# Patient Record
Sex: Female | Born: 1950 | Race: White | Hispanic: No | Marital: Married | Smoking: Never smoker
Health system: Southern US, Community
[De-identification: ages and names within clinical notes are randomized; demographics above are authoritative.]

## PROBLEM LIST (undated history)

## (undated) DIAGNOSIS — F419 Anxiety disorder, unspecified: Secondary | ICD-10-CM

## (undated) DIAGNOSIS — M549 Dorsalgia, unspecified: Secondary | ICD-10-CM

## (undated) DIAGNOSIS — E785 Hyperlipidemia, unspecified: Secondary | ICD-10-CM

## (undated) DIAGNOSIS — E119 Type 2 diabetes mellitus without complications: Secondary | ICD-10-CM

## (undated) DIAGNOSIS — G629 Polyneuropathy, unspecified: Secondary | ICD-10-CM

## (undated) DIAGNOSIS — I639 Cerebral infarction, unspecified: Secondary | ICD-10-CM

## (undated) DIAGNOSIS — I10 Essential (primary) hypertension: Secondary | ICD-10-CM

## (undated) HISTORY — DX: Polyneuropathy, unspecified: G62.9

## (undated) HISTORY — PX: TONSILLECTOMY AND ADENOIDECTOMY: SUR1326

## (undated) HISTORY — DX: Essential (primary) hypertension: I10

## (undated) HISTORY — DX: Dorsalgia, unspecified: M54.9

## (undated) HISTORY — DX: Type 2 diabetes mellitus without complications: E11.9

## (undated) HISTORY — PX: CHOLECYSTECTOMY: SHX55

## (undated) HISTORY — DX: Hyperlipidemia, unspecified: E78.5

## (undated) HISTORY — PX: ABDOMINAL HYSTERECTOMY: SHX81

## (undated) HISTORY — DX: Anxiety disorder, unspecified: F41.9

---

## 2008-12-05 ENCOUNTER — Encounter: Payer: Self-pay | Admitting: Pulmonary Disease

## 2008-12-05 DIAGNOSIS — J40 Bronchitis, not specified as acute or chronic: Secondary | ICD-10-CM | POA: Insufficient documentation

## 2009-03-25 ENCOUNTER — Encounter: Payer: Self-pay | Admitting: Cardiology

## 2009-03-27 ENCOUNTER — Encounter: Payer: Self-pay | Admitting: Cardiology

## 2009-04-03 ENCOUNTER — Encounter: Payer: Self-pay | Admitting: Cardiology

## 2009-04-15 ENCOUNTER — Encounter: Payer: Self-pay | Admitting: Cardiology

## 2009-04-21 ENCOUNTER — Encounter: Payer: Self-pay | Admitting: Cardiology

## 2009-05-30 DIAGNOSIS — F329 Major depressive disorder, single episode, unspecified: Secondary | ICD-10-CM

## 2009-05-30 DIAGNOSIS — R42 Dizziness and giddiness: Secondary | ICD-10-CM

## 2009-05-30 DIAGNOSIS — E113299 Type 2 diabetes mellitus with mild nonproliferative diabetic retinopathy without macular edema, unspecified eye: Secondary | ICD-10-CM

## 2009-05-30 DIAGNOSIS — R55 Syncope and collapse: Secondary | ICD-10-CM | POA: Insufficient documentation

## 2009-05-30 DIAGNOSIS — R030 Elevated blood-pressure reading, without diagnosis of hypertension: Secondary | ICD-10-CM | POA: Insufficient documentation

## 2009-05-30 DIAGNOSIS — I10 Essential (primary) hypertension: Secondary | ICD-10-CM

## 2009-05-30 DIAGNOSIS — R7309 Other abnormal glucose: Secondary | ICD-10-CM

## 2009-05-30 DIAGNOSIS — E1039 Type 1 diabetes mellitus with other diabetic ophthalmic complication: Secondary | ICD-10-CM | POA: Insufficient documentation

## 2009-06-02 ENCOUNTER — Encounter: Payer: Self-pay | Admitting: Cardiology

## 2014-03-04 LAB — HM DIABETES EYE EXAM

## 2016-05-03 ENCOUNTER — Encounter: Payer: Self-pay | Admitting: Physician Assistant

## 2016-05-03 ENCOUNTER — Ambulatory Visit (INDEPENDENT_AMBULATORY_CARE_PROVIDER_SITE_OTHER): Payer: BLUE CROSS/BLUE SHIELD | Admitting: Physician Assistant

## 2016-05-03 ENCOUNTER — Encounter (INDEPENDENT_AMBULATORY_CARE_PROVIDER_SITE_OTHER): Payer: Self-pay

## 2016-05-03 VITALS — BP 129/87 | HR 86 | Temp 98.3°F | Ht 64.5 in | Wt 157.6 lb

## 2016-05-03 DIAGNOSIS — Z1159 Encounter for screening for other viral diseases: Secondary | ICD-10-CM

## 2016-05-03 DIAGNOSIS — M5090 Cervical disc disorder, unspecified, unspecified cervical region: Secondary | ICD-10-CM

## 2016-05-03 DIAGNOSIS — F329 Major depressive disorder, single episode, unspecified: Secondary | ICD-10-CM | POA: Diagnosis not present

## 2016-05-03 DIAGNOSIS — F32A Depression, unspecified: Secondary | ICD-10-CM

## 2016-05-03 DIAGNOSIS — M5137 Other intervertebral disc degeneration, lumbosacral region: Secondary | ICD-10-CM | POA: Diagnosis not present

## 2016-05-03 DIAGNOSIS — Z6826 Body mass index (BMI) 26.0-26.9, adult: Secondary | ICD-10-CM

## 2016-05-03 DIAGNOSIS — E113299 Type 2 diabetes mellitus with mild nonproliferative diabetic retinopathy without macular edema, unspecified eye: Secondary | ICD-10-CM

## 2016-05-03 DIAGNOSIS — I10 Essential (primary) hypertension: Secondary | ICD-10-CM | POA: Diagnosis not present

## 2016-05-03 DIAGNOSIS — E785 Hyperlipidemia, unspecified: Secondary | ICD-10-CM | POA: Diagnosis not present

## 2016-05-03 DIAGNOSIS — E1039 Type 1 diabetes mellitus with other diabetic ophthalmic complication: Secondary | ICD-10-CM | POA: Diagnosis not present

## 2016-05-03 LAB — BAYER DCA HB A1C WAIVED: HB A1C (BAYER DCA - WAIVED): 8.7 % — ABNORMAL HIGH (ref <7.0)

## 2016-05-03 MED ORDER — OXYCODONE HCL 30 MG PO TABS: 30.0000 mg | ORAL_TABLET | Freq: Four times a day (QID) | ORAL | 0 refills | Status: DC

## 2016-05-03 MED ORDER — METHYLPREDNISOLONE ACETATE 80 MG/ML IJ SUSP
80.0000 mg | Freq: Once | INTRAMUSCULAR | Status: AC
  Administered 2016-05-03: 80 mg via INTRAMUSCULAR

## 2016-05-03 MED ORDER — GABAPENTIN 800 MG PO TABS: 800.0000 mg | ORAL_TABLET | Freq: Three times a day (TID) | ORAL | 11 refills | Status: DC

## 2016-05-03 MED ORDER — MELOXICAM 7.5 MG PO TABS: 7.5000 mg | ORAL_TABLET | Freq: Every day | ORAL | 11 refills | Status: DC

## 2016-05-03 NOTE — Patient Instructions (Signed)

## 2016-05-03 NOTE — Progress Notes (Signed)
BP 129/87 (BP Location: Left Arm, Patient Position: Sitting, Cuff Size: Normal)   Pulse 86   Temp 98.3 F (36.8 C) (Oral)   Ht 5' 4.5" (1.638 m)   Wt 157 lb 9.6 oz (71.5 kg)   BMI 26.63 kg/m    Subjective:    Patient ID: Caitlin Pennington, female    DOB: 1951/06/20, 65 y.o.   MRN: [id number]  HPI: Caitlin Pennington is a 65 y.o. female presenting on 05/03/2016 for Back Pain (Medication refill )  HPI Patient here to be established as new patient at [facility].  This patient is known to me from [facility] This patient comes in having significantly increased lumbar pain related to her degenerative disc disease. She had an MRI that showed positive degenerative disc disorder and some arthritis changes. She would like an arthritis medication if possible. We have discussed Mobic as a possibility. In addition she is due her medication refills on her oxycodone. We have discussed that at [facility] anyone on chronic pain medication has to be followed up pain management. She is in agreement to see a specialist for this. She is also hopeful that they may have some other options to help with her pain. The neurosurgeon did not feel that she was a surgical candidate at this time after her MRI. Although this got tremendously started approximately 1 year ago when she had retinal tears bilaterally that had a repaired with an air bubble procedure. She had to sleep in the downward facing chair. She develops severe cervical and lumbar pain having to sleep like this. Her healing on her eyes took longer than normal also.  Relevant past medical, surgical, family and social history reviewed and updated as indicated. Interim medical history since our last visit reviewed. Allergies and medications reviewed and updated.   Data reviewed from any sources in EPIC.  Review of Systems  Constitutional: Negative for activity change, fatigue and fever.  HENT: Negative.   Eyes:  Negative.   Respiratory: Negative.  Negative for cough.   Cardiovascular: Negative.  Negative for chest pain.  Gastrointestinal: Negative.  Negative for abdominal pain.  Endocrine: Negative.   Genitourinary: Negative.  Negative for dysuria.  Musculoskeletal: Positive for arthralgias, back pain, gait problem, myalgias and neck stiffness.  Skin: Negative.   Neurological: Positive for weakness.    Per HPI unless specifically indicated above  Social History   Social History  . Marital status: Married    Spouse name: N/A  . Number of children: N/A  . Years of education: N/A   Occupational History  . Not on file.   Social History Main Topics  . Smoking status: Never Smoker  . Smokeless tobacco: Never Used  . Alcohol use No  . Drug use: No  . Sexual activity: Not on file   Other Topics Concern  . Not on file   Social History Narrative  . No narrative on file    Past Surgical History:  Procedure Laterality Date  . ABDOMINAL HYSTERECTOMY    . CHOLECYSTECTOMY    . TONSILLECTOMY AND ADENOIDECTOMY      Family History  Problem Relation Age of Onset  . Congestive Heart Failure Mother   . Diabetes Mother   . Heart disease Father       Medication List       Accurate as of 05/03/16 12:04 PM. Always use your most recent med list.          ALPRAZolam  1 MG tablet Commonly known as:  XANAX Take 1 mg by mouth 3 (three) times daily.   gabapentin 800 MG tablet Commonly known as:  NEURONTIN Take 1 tablet (800 mg total) by mouth 3 (three) times daily.   GLOBAL EASE INJECT PEN NEEDLES 32G X 4 MM Misc Generic drug:  Insulin Pen Needle USE WITH INSULIN PENS UP TO 5 TIMES DAILY (BILL INSULIN FIRST FOR A $0 COPAY)   hydrochlorothiazide 25 MG tablet Commonly known as:  HYDRODIURIL Take 25 mg by mouth daily.   insulin aspart 100 UNIT/ML injection Commonly known as:  novoLOG Inject 20 Units into the skin 3 (three) times daily before meals.   LANTUS SOLOSTAR 100 UNIT/ML  Solostar Pen Generic drug:  Insulin Glargine Inject 30 Units into the skin at bedtime.   meloxicam 7.5 MG tablet Commonly known as:  MOBIC Take 1 tablet (7.5 mg total) by mouth daily.   oxycodone 30 MG immediate release tablet Commonly known as:  ROXICODONE Take 1 tablet (30 mg total) by mouth every 6 (six) hours.   pantoprazole 40 MG tablet Commonly known as:  PROTONIX Take 40 mg by mouth daily.   VICTOZA 18 MG/3ML Sopn Generic drug:  Liraglutide Inject 1.8 mg into the skin.          Objective:    BP 129/87 (BP Location: Left Arm, Patient Position: Sitting, Cuff Size: Normal)   Pulse 86   Temp 98.3 F (36.8 C) (Oral)   Ht 5' 4.5" (1.638 m)   Wt 157 lb 9.6 oz (71.5 kg)   BMI 26.63 kg/m   Allergies  Allergen Reactions  . Demerol [Meperidine] Swelling  . Stadol [Butorphanol] Swelling   Wt Readings from Last 3 Encounters:  05/03/16 157 lb 9.6 oz (71.5 kg)    Physical Exam  Constitutional: She is oriented to person, place, and time. She appears well-developed and well-nourished.  HENT:  Head: Normocephalic and atraumatic.  Eyes: Conjunctivae and EOM are normal. Pupils are equal, round, and reactive to light.  Neck: Normal range of motion. Neck supple.  Cardiovascular: Normal rate, regular rhythm, normal heart sounds and intact distal pulses.   Pulmonary/Chest: Effort normal and breath sounds normal.  Abdominal: Soft. Bowel sounds are normal.  Musculoskeletal: She exhibits tenderness and deformity.       Lumbar back: She exhibits decreased range of motion, tenderness, swelling, deformity, pain and spasm.  Neurological: She is alert and oriented to person, place, and time. She has normal reflexes.  Skin: Skin is warm and dry. No rash noted.  Psychiatric: She has a normal mood and affect. Her behavior is normal. Judgment and thought content normal.    No results found for this or any previous visit.    Assessment & Plan:   1. Essential hypertension Low salt  diet, walk daily  2. Background diabetic retinopathy ([place]) Continue with Retina Specialist  3. Depression Continue medications  4. Cervical disc disease Heat and stretch - meloxicam (MOBIC) 7.5 MG tablet; Take 1 tablet (7.5 mg total) by mouth daily.  Dispense: 30 tablet; Refill: 11 - oxycodone (ROXICODONE) 30 MG immediate release tablet; Take 1 tablet (30 mg total) by mouth every 6 (six) hours.  Dispense: 240 tablet; Refill: 0  5. Degeneration of lumbar or lumbosacral intervertebral disc Heat and stretch Neurosurgeon saw MRI from this past year and saw her and did not recommended surgery at this time.  We have discussed that the policy at [facility] is to have patients who take chronic  pain medications to be followed by Pain Management and she is very agreeable to have the referral and any possible solutions they may have. Will obtain records from [facility]. - meloxicam (MOBIC) 7.5 MG tablet; Take 1 tablet (7.5 mg total) by mouth daily.  Dispense: 30 tablet; Refill: 11 - oxycodone (ROXICODONE) 30 MG immediate release tablet; Take 1 tablet (30 mg total) by mouth every 6 (six) hours.  Dispense: 240 tablet; Refill: 0  6. 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 Need for hepatitis C screening test Patient aware and will get tested - Hepatitis C antibody  No other refills are due today but she is to continue all listed meds Continue all other maintenance medications as listed above.  Follow up plan: Return in about 3 months (around 08/03/2016), or lab and recheck.  Terald Sleeper PA-C [facility] [address]  [place], [place] [address] [phone]   05/03/2016, 12:04 PM

## 2016-05-04 LAB — CMP14+EGFR
ALBUMIN: 4.3 g/dL (ref 3.6–4.8)
ALT: 16 IU/L (ref 0–32)
AST: 18 IU/L (ref 0–40)
Albumin/Globulin Ratio: 1.5 (ref 1.2–2.2)
Alkaline Phosphatase: 70 IU/L (ref 39–117)
BUN/Creatinine Ratio: 14 (ref 12–28)
BUN: 11 mg/dL (ref 8–27)
Bilirubin Total: 0.3 mg/dL (ref 0.0–1.2)
CALCIUM: 9.6 mg/dL (ref 8.7–10.3)
CHLORIDE: 97 mmol/L (ref 96–106)
CO2: 27 mmol/L (ref 18–29)
CREATININE: 0.77 mg/dL (ref 0.57–1.00)
GFR calc Af Amer: 94 mL/min/{1.73_m2} (ref >59)
GFR, EST NON AFRICAN AMERICAN: 81 mL/min/{1.73_m2} (ref >59)
GLOBULIN, TOTAL: 2.8 g/dL (ref 1.5–4.5)
GLUCOSE: 120 mg/dL — AB (ref 65–99)
POTASSIUM: 4.3 mmol/L (ref 3.5–5.2)
Sodium: 141 mmol/L (ref 134–144)
Total Protein: 7.1 g/dL (ref 6.0–8.5)

## 2016-05-04 LAB — LIPID PANEL
CHOLESTEROL TOTAL: 245 mg/dL — AB (ref 100–199)
Chol/HDL Ratio: 5.1 ratio units — ABNORMAL HIGH (ref 0.0–4.4)
HDL: 48 mg/dL (ref >39)
LDL Calculated: 162 mg/dL — ABNORMAL HIGH (ref 0–99)
Triglycerides: 175 mg/dL — ABNORMAL HIGH (ref 0–149)
VLDL Cholesterol Cal: 35 mg/dL (ref 5–40)

## 2016-05-04 LAB — HEPATITIS C ANTIBODY: Hep C Virus Ab: <0.1 s/co ratio (ref 0.0–0.9)

## 2016-05-20 ENCOUNTER — Telehealth: Payer: Self-pay | Admitting: Physician Assistant

## 2016-05-20 NOTE — Telephone Encounter (Signed)
Spoke with patient and explained where to look for messages and labs in [place].

## 2016-05-25 ENCOUNTER — Other Ambulatory Visit: Payer: Self-pay

## 2016-05-25 ENCOUNTER — Telehealth: Payer: Self-pay | Admitting: Physician Assistant

## 2016-05-25 MED ORDER — FENOFIBRATE MICRONIZED 134 MG PO CAPS
134.0000 mg | ORAL_CAPSULE | Freq: Every day | ORAL | 11 refills | Status: DC
Start: 2016-05-25 — End: 2017-06-15

## 2016-05-25 NOTE — Telephone Encounter (Signed)
Medication sent.

## 2016-06-17 DIAGNOSIS — M47816 Spondylosis without myelopathy or radiculopathy, lumbar region: Secondary | ICD-10-CM | POA: Diagnosis not present

## 2016-06-17 DIAGNOSIS — Z79899 Other long term (current) drug therapy: Secondary | ICD-10-CM | POA: Diagnosis not present

## 2016-06-17 DIAGNOSIS — G8929 Other chronic pain: Secondary | ICD-10-CM | POA: Diagnosis not present

## 2016-06-17 DIAGNOSIS — M5136 Other intervertebral disc degeneration, lumbar region: Secondary | ICD-10-CM | POA: Diagnosis not present

## 2016-06-17 DIAGNOSIS — G629 Polyneuropathy, unspecified: Secondary | ICD-10-CM | POA: Diagnosis not present

## 2016-06-24 ENCOUNTER — Other Ambulatory Visit: Payer: Self-pay | Admitting: Physician Assistant

## 2016-06-30 ENCOUNTER — Ambulatory Visit (INDEPENDENT_AMBULATORY_CARE_PROVIDER_SITE_OTHER): Payer: BLUE CROSS/BLUE SHIELD | Admitting: Physician Assistant

## 2016-06-30 ENCOUNTER — Encounter: Payer: Self-pay | Admitting: Physician Assistant

## 2016-06-30 VITALS — BP 154/82 | HR 78 | Temp 97.9°F | Ht 64.0 in | Wt 162.0 lb

## 2016-06-30 DIAGNOSIS — M5137 Other intervertebral disc degeneration, lumbosacral region: Secondary | ICD-10-CM

## 2016-06-30 DIAGNOSIS — Z8261 Family history of arthritis: Secondary | ICD-10-CM | POA: Diagnosis not present

## 2016-06-30 DIAGNOSIS — M2550 Pain in unspecified joint: Secondary | ICD-10-CM | POA: Diagnosis not present

## 2016-06-30 DIAGNOSIS — M5090 Cervical disc disorder, unspecified, unspecified cervical region: Secondary | ICD-10-CM | POA: Diagnosis not present

## 2016-06-30 DIAGNOSIS — M2560 Stiffness of unspecified joint, not elsewhere classified: Secondary | ICD-10-CM

## 2016-06-30 DIAGNOSIS — Z23 Encounter for immunization: Secondary | ICD-10-CM | POA: Diagnosis not present

## 2016-06-30 MED ORDER — OXYCODONE HCL 30 MG PO TABS: 30.0000 mg | ORAL_TABLET | Freq: Four times a day (QID) | ORAL | 0 refills | Status: DC

## 2016-06-30 MED ORDER — PREDNISONE 20 MG PO TABS: 40.0000 mg | ORAL_TABLET | Freq: Every day | ORAL | 0 refills | Status: DC

## 2016-06-30 MED ORDER — OXYCODONE HCL 30 MG PO TABS: 30.0000 mg | ORAL_TABLET | ORAL | 0 refills | Status: DC | PRN

## 2016-06-30 NOTE — Patient Instructions (Signed)
Rheumatoid Arthritis  Rheumatoid arthritis is a long-term (chronic) inflammatory disease that causes pain, swelling, and stiffness of the joints. It can affect the entire body, including the eyes and lungs. The effects of rheumatoid arthritis vary widely among those with the condition.  CAUSES  The cause of rheumatoid arthritis is not known. It tends to run in families and is more common in women. Certain cells of the body's natural defense system (immune system) do not work properly and begin to attack healthy joints. It primarily involves the connective tissue that lines the joints (synovial membrane). This can cause damage to the joint.  SYMPTOMS  · Pain, stiffness, swelling, and decreased motion of many joints, especially in the hands and feet.  · Stiffness that is worse in the morning. It may last 1-2 hours or longer.  · Numbness and tingling in the hands.  · Fatigue.  · Loss of appetite.  · Weight loss.  · Low-grade fever.  · Dry eyes and mouth.  · Firm lumps (rheumatoid nodules) that grow beneath the skin in areas such as the elbows and hands.  DIAGNOSIS  Diagnosis is based on the symptoms described, an exam, and blood tests. Sometimes, X-rays are helpful.  TREATMENT  The goals of treatment are to relieve pain, reduce inflammation, and to slow down or stop joint damage and disability. Methods vary and may include:  · Maintaining a balance of rest, exercise, and proper nutrition.  · Your health care provider may adjust your medicines every 3 months until treatment goals are reached. Common medicines include:    Pain relievers (analgesics).    Corticosteroids and nonsteroidal anti-inflammatory drugs (NSAIDs) to reduce inflammation.    Disease-modifying antirheumatic drugs (DMARDs) to try to slow the course of the disease.    Biologic response modifiers to reduce inflammation and damage.  · Physical therapy and occupational therapy.  · Surgery for patients with severe joint damage. Joint replacement or fusing of  joints may be needed.  · Routine monitoring and ongoing care, such as office visits, blood and urine tests, and X-rays.  Your health care provider will work with you to identify the best treatment option for you, based on an assessment of the overall disease activity in your body.  HOME CARE INSTRUCTIONS  · Remain physically active and reduce activity when the disease gets worse.  · Eat a well-balanced diet.  · Put heat on affected joints when you wake up and before activities. Keep the heat on the affected joint for as long as directed by your health care provider.  · Put ice on affected joints following activities or exercising.    Put ice in a plastic bag.    Place a towel between your skin and the bag.    Leave the ice on for 15-20 minutes, 3-4 times per day, or as directed by your health care provider.  · Take medicines and supplements only as directed by your health care provider.  · Use splints as directed by your health care provider. Splints help maintain joint position and function.  · Do not sleep with pillows under your knees. This may lead to spasms.  · Participate in a self-management program to keep current with the latest treatment and coping skills.  SEEK IMMEDIATE MEDICAL CARE IF:  · You have fainting episodes.  · You have periods of extreme weakness.  · You rapidly develop a hot, painful joint that is more severe than usual joint aches.  · You have chills.  ·   You have a fever.  FOR MORE INFORMATION  · American College of Rheumatology: www.rheumatology.org  · Arthritis Foundation: www.arthritis.org     This information is not intended to replace advice given to you by your health care provider. Make sure you discuss any questions you have with your health care provider.     Document Released: 08/20/2000 Document Revised: 09/13/2014 Document Reviewed: 09/29/2011  Elsevier Interactive Patient Education ©2016 Elsevier Inc.

## 2016-06-30 NOTE — Progress Notes (Addendum)
BP (!) 154/82   Pulse 78   Temp 97.9 F (36.6 C) (Oral)   Ht 5\' 4"  (1.626 m)   Wt 162 lb (73.5 kg)   BMI 27.81 kg/m    Subjective:    Patient ID: Caitlin Pennington, female    DOB: 03/09/1951, 65 y.o.   MRN: [id number]  HPI: Caitlin BentCarol Ronk is a 65 y.o. female presenting on 06/30/2016 for Neck Pain (2 weeks) and left knee pain  Patient has had severe AM stiffness that lasts greater than 30 minutes for about 4 weeks. Has family history of Ra. She has known DDD of the lumbar spine, but due to very complicated retinal surgeries she has had to postpone her treatment.  Joints do have swelling and redness at times. She is currently on mobic 7.5 mg.  Her known degenerative disc disease of the cervical and lumbar spine have been an ongoing problem over the past 2 years. She has actually had FMLA leave related to this. In addition she had had in the past and family leave related to her diabetes management but at this time it is fairly well-controlled. She may need to have FMLA paperwork performed that will allow her to miss work for needed future appointments with us or with referral specialist.  Past Medical History:  Diagnosis Date  . Anxiety   . Back pain   . Diabetes mellitus without complication (HCC)   . Hyperlipidemia   . Hypertension   . Neuropathy (HCC)    Relevant past medical, surgical, family and social history reviewed and updated as indicated. Interim medical history since our last visit reviewed. Allergies and medications reviewed and updated. DATA REVIEWED: CHART IN EPIC  Social History   Social History  . Marital status: Married    Spouse name: N/A  . Number of children: N/A  . Years of education: N/A   Occupational History  . Not on file.   Social History Main Topics  . Smoking status: Never Smoker  . Smokeless tobacco: Never Used  . Alcohol use No  . Drug use: No  . Sexual activity: Not on file   Other Topics Concern  . Not on file   Social History Narrative  .  No narrative on file    Past Surgical History:  Procedure Laterality Date  . ABDOMINAL HYSTERECTOMY    . CHOLECYSTECTOMY    . TONSILLECTOMY AND ADENOIDECTOMY      Family History  Problem Relation Age of Onset  . Congestive Heart Failure Mother   . Diabetes Mother   . Heart disease Father     Review of Systems  Constitutional: Positive for fatigue and fever. Negative for activity change.  HENT: Negative.   Eyes: Negative.   Respiratory: Negative.  Negative for cough.   Cardiovascular: Negative.  Negative for chest pain.  Gastrointestinal: Negative.  Negative for abdominal pain.  Endocrine: Negative.   Genitourinary: Negative.  Negative for dysuria.  Musculoskeletal: Positive for arthralgias, back pain, gait problem, joint swelling, myalgias and neck stiffness.  Skin: Negative.       Medication List       Accurate as of 06/30/16  5:03 PM. Always use your most recent med list.          ALPRAZolam 1 MG tablet Commonly known as:  XANAX TAKE 1 TABLET BY MOUTH THREE TIMES DAILY   fenofibrate micronized 134 MG capsule Commonly known as:  LOFIBRA Take 1 capsule (134 mg total) by mouth daily before breakfast.  gabapentin 800 MG tablet Commonly known as:  NEURONTIN Take 1 tablet (800 mg total) by mouth 3 (three) times daily.   GLOBAL EASE INJECT PEN NEEDLES 32G X 4 MM Misc Generic drug:  Insulin Pen Needle USE WITH INSULIN PENS UP TO 5 TIMES DAILY (BILL INSULIN FIRST FOR A $0 COPAY)   hydrochlorothiazide 25 MG tablet Commonly known as:  HYDRODIURIL Take 25 mg by mouth daily.   insulin aspart 100 UNIT/ML injection Commonly known as:  novoLOG Inject 20 Units into the skin 3 (three) times daily before meals.   LANTUS SOLOSTAR 100 UNIT/ML Solostar Pen Generic drug:  Insulin Glargine Inject 30 Units into the skin at bedtime.   meloxicam 7.5 MG tablet Commonly known as:  MOBIC Take 1 tablet (7.5 mg total) by mouth daily.   oxycodone 30 MG immediate release  tablet Commonly known as:  ROXICODONE Take 1 tablet (30 mg total) by mouth every 6 (six) hours.   oxycodone 30 MG immediate release tablet Commonly known as:  ROXICODONE Take 1 tablet (30 mg total) by mouth every 4 (four) hours as needed for pain.   pantoprazole 40 MG tablet Commonly known as:  PROTONIX Take 40 mg by mouth daily.   predniSONE 20 MG tablet Commonly known as:  DELTASONE Take 2 tablets (40 mg total) by mouth daily with breakfast. After 14 days, reduce to 20 mg once daily, then 7 days take 10 daily   VICTOZA 18 MG/3ML Sopn Generic drug:  liraglutide Inject 1.8 mg into the skin.          Objective:    BP (!) 154/82   Pulse 78   Temp 97.9 F (36.6 C) (Oral)   Ht 5\' 4"  (1.626 m)   Wt 162 lb (73.5 kg)   BMI 27.81 kg/m   Allergies  Allergen Reactions  . Demerol [Meperidine] Swelling  . Stadol [Butorphanol] Swelling    Wt Readings from Last 3 Encounters:  06/30/16 162 lb (73.5 kg)  05/03/16 157 lb 9.6 oz (71.5 kg)    Physical Exam  Constitutional: She is oriented to person, place, and time. She appears well-developed and well-nourished.  HENT:  Head: Normocephalic and atraumatic.  Eyes: Conjunctivae and EOM are normal. Pupils are equal, round, and reactive to light.  Cardiovascular: Normal rate, regular rhythm, normal heart sounds and intact distal pulses.   Pulmonary/Chest: Effort normal and breath sounds normal.  Abdominal: Soft. Bowel sounds are normal.  Musculoskeletal:       Right knee: She exhibits decreased range of motion and effusion. Tenderness found.       Left knee: She exhibits decreased range of motion and effusion. Tenderness found.       Cervical back: She exhibits decreased range of motion, pain and spasm.       Lumbar back: She exhibits decreased range of motion, tenderness, deformity and spasm.  Neurological: She is alert and oriented to person, place, and time. She has normal reflexes.  Skin: Skin is warm and dry. No rash noted.    Psychiatric: She has a normal mood and affect. Her behavior is normal. Judgment and thought content normal.    Results for orders placed or performed in visit on 05/11/16  HM DIABETES EYE EXAM  Result Value Ref Range   HM Diabetic Eye Exam No Retinopathy No Retinopathy      Assessment & Plan:   1. Pain in joint involving multiple sites - Arthritis Panel - predniSONE (DELTASONE) 20 MG tablet; Take 2 tablets (  40 mg total) by mouth daily with breakfast. After 14 days, reduce to 20 mg once daily, then 7 days take 10 daily  Dispense: 40 tablet; Refill: 0 - ANA - oxycodone (ROXICODONE) 30 MG immediate release tablet; Take 1 tablet (30 mg total) by mouth every 4 (four) hours as needed for pain.  Dispense: 120 tablet; Refill: 0  2. Stiffness in joint - Arthritis Panel - predniSONE (DELTASONE) 20 MG tablet; Take 2 tablets (40 mg total) by mouth daily with breakfast. After 14 days, reduce to 20 mg once daily, then 7 days take 10 daily  Dispense: 40 tablet; Refill: 0 - ANA  3. Family history of rheumatoid arthritis - Arthritis Panel - predniSONE (DELTASONE) 20 MG tablet; Take 2 tablets (40 mg total) by mouth daily with breakfast. After 14 days, reduce to 20 mg once daily, then 7 days take 10 daily  Dispense: 40 tablet; Refill: 0  4. Cervical disc disease - oxycodone (ROXICODONE) 30 MG immediate release tablet; Take 1 tablet (30 mg total) by mouth every 6 (six) hours.  Dispense: 120 tablet; Refill: 0  5. Degeneration of lumbar or lumbosacral intervertebral disc - oxycodone (ROXICODONE) 30 MG immediate release tablet; Take 1 tablet (30 mg total) by mouth every 6 (six) hours.  Dispense: 120 tablet; Refill: 0 - oxycodone (ROXICODONE) 30 MG immediate release tablet; Take 1 tablet (30 mg total) by mouth every 4 (four) hours as needed for pain.  Dispense: 120 tablet; Refill: 0 Had issues at the [facility] Center with the attitude of the staff.  Was in tears when she left. Consider a different office, also  wait for labs to look at rheumatological conditions.  6. Encounter for immunization - Flu Vaccine QUAD 36+ mos IM   Continue all other maintenance medications as listed above.  Follow up plan: Return in about 3 months (around 09/30/2016) for recheck.  Orders Placed This Encounter  Procedures  . Flu Vaccine QUAD 36+ mos IM  . Arthritis Panel  . ANA    Educational handout given for rheumatoid arthritis  Remus Loffler PA-C [facility] [address]  [place], [place] [address] [phone]   06/30/2016, 5:03 PM

## 2016-07-01 LAB — ARTHRITIS PANEL
BASOS ABS: 0 10*3/uL (ref 0.0–0.2)
Basos: 1 %
EOS (ABSOLUTE): 0.1 10*3/uL (ref 0.0–0.4)
EOS: 2 %
HEMATOCRIT: 38.2 % (ref 34.0–46.6)
Hemoglobin: 12.8 g/dL (ref 11.1–15.9)
IMMATURE GRANULOCYTES: 0 %
Immature Grans (Abs): 0 10*3/uL (ref 0.0–0.1)
LYMPHS ABS: 1.5 10*3/uL (ref 0.7–3.1)
Lymphs: 22 %
MCH: 31.3 pg (ref 26.6–33.0)
MCHC: 33.5 g/dL (ref 31.5–35.7)
MCV: 93 fL (ref 79–97)
MONOCYTES: 9 %
Monocytes Absolute: 0.6 10*3/uL (ref 0.1–0.9)
NEUTROS PCT: 66 %
Neutrophils Absolute: 4.5 10*3/uL (ref 1.4–7.0)
PLATELETS: 239 10*3/uL (ref 150–379)
RBC: 4.09 x10E6/uL (ref 3.77–5.28)
RDW: 14.2 % (ref 12.3–15.4)
Rhuematoid fact SerPl-aCnc: <10 IU/mL (ref 0.0–13.9)
SED RATE: 2 mm/h (ref 0–40)
Uric Acid: 4.3 mg/dL (ref 2.5–7.1)
WBC: 6.9 10*3/uL (ref 3.4–10.8)

## 2016-07-01 LAB — ANA: ANA: POSITIVE — AB

## 2016-07-02 ENCOUNTER — Telehealth: Payer: Self-pay | Admitting: Physician Assistant

## 2016-07-02 DIAGNOSIS — R768 Other specified abnormal immunological findings in serum: Secondary | ICD-10-CM

## 2016-07-02 NOTE — Telephone Encounter (Signed)
Spoke with patient about labs and referral placed for rheumatology.

## 2016-07-02 NOTE — Telephone Encounter (Signed)
Patient advised that we can not approve her to have her pain medication filled a day early.

## 2016-07-13 ENCOUNTER — Other Ambulatory Visit: Payer: Self-pay | Admitting: Physician Assistant

## 2016-07-14 MED ORDER — ALPRAZOLAM 1 MG PO TABS
1.0000 mg | ORAL_TABLET | Freq: Three times a day (TID) | ORAL | 1 refills | Status: DC
Start: 2016-07-14 — End: 2016-09-15

## 2016-07-14 NOTE — Telephone Encounter (Signed)
okayed

## 2016-07-14 NOTE — Telephone Encounter (Signed)
Rx called in to pharmacy. 

## 2016-07-26 ENCOUNTER — Other Ambulatory Visit: Payer: Self-pay | Admitting: Physician Assistant

## 2016-08-03 ENCOUNTER — Ambulatory Visit: Payer: Medicare Other | Admitting: Physician Assistant

## 2016-08-19 ENCOUNTER — Other Ambulatory Visit: Payer: Self-pay | Admitting: Physician Assistant

## 2016-09-15 ENCOUNTER — Other Ambulatory Visit: Payer: Self-pay | Admitting: Physician Assistant

## 2016-09-16 NOTE — Telephone Encounter (Signed)
rx called into pharmacy and pt is aware. 

## 2016-09-30 ENCOUNTER — Ambulatory Visit (INDEPENDENT_AMBULATORY_CARE_PROVIDER_SITE_OTHER): Payer: BLUE CROSS/BLUE SHIELD | Admitting: Physician Assistant

## 2016-09-30 ENCOUNTER — Encounter: Payer: Self-pay | Admitting: Physician Assistant

## 2016-09-30 VITALS — BP 144/84 | HR 81 | Temp 98.0°F | Ht 64.0 in | Wt 157.8 lb

## 2016-09-30 DIAGNOSIS — M5137 Other intervertebral disc degeneration, lumbosacral region: Secondary | ICD-10-CM

## 2016-09-30 DIAGNOSIS — M2550 Pain in unspecified joint: Secondary | ICD-10-CM | POA: Insufficient documentation

## 2016-09-30 DIAGNOSIS — F411 Generalized anxiety disorder: Secondary | ICD-10-CM | POA: Insufficient documentation

## 2016-09-30 DIAGNOSIS — I10 Essential (primary) hypertension: Secondary | ICD-10-CM

## 2016-09-30 DIAGNOSIS — E113299 Type 2 diabetes mellitus with mild nonproliferative diabetic retinopathy without macular edema, unspecified eye: Secondary | ICD-10-CM

## 2016-09-30 DIAGNOSIS — M5090 Cervical disc disorder, unspecified, unspecified cervical region: Secondary | ICD-10-CM

## 2016-09-30 DIAGNOSIS — H35723 Serous detachment of retinal pigment epithelium, bilateral: Secondary | ICD-10-CM

## 2016-09-30 DIAGNOSIS — H35721 Serous detachment of retinal pigment epithelium, right eye: Secondary | ICD-10-CM | POA: Insufficient documentation

## 2016-09-30 DIAGNOSIS — E782 Mixed hyperlipidemia: Secondary | ICD-10-CM

## 2016-09-30 DIAGNOSIS — E10311 Type 1 diabetes mellitus with unspecified diabetic retinopathy with macular edema: Secondary | ICD-10-CM | POA: Diagnosis not present

## 2016-09-30 DIAGNOSIS — Z0289 Encounter for other administrative examinations: Secondary | ICD-10-CM

## 2016-09-30 LAB — CMP14+EGFR
ALBUMIN: 4.6 g/dL (ref 3.6–4.8)
ALT: 17 IU/L (ref 0–32)
AST: 18 IU/L (ref 0–40)
Albumin/Globulin Ratio: 1.5 (ref 1.2–2.2)
Alkaline Phosphatase: 68 IU/L (ref 39–117)
BUN / CREAT RATIO: 19 (ref 12–28)
BUN: 13 mg/dL (ref 8–27)
Bilirubin Total: 0.3 mg/dL (ref 0.0–1.2)
CALCIUM: 10.2 mg/dL (ref 8.7–10.3)
CO2: 26 mmol/L (ref 18–29)
CREATININE: 0.69 mg/dL (ref 0.57–1.00)
Chloride: 97 mmol/L (ref 96–106)
GFR calc non Af Amer: 91 mL/min/{1.73_m2} (ref >59)
GFR, EST AFRICAN AMERICAN: 105 mL/min/{1.73_m2} (ref >59)
GLUCOSE: 143 mg/dL — AB (ref 65–99)
Globulin, Total: 3 g/dL (ref 1.5–4.5)
Potassium: 4.5 mmol/L (ref 3.5–5.2)
Sodium: 139 mmol/L (ref 134–144)
TOTAL PROTEIN: 7.6 g/dL (ref 6.0–8.5)

## 2016-09-30 LAB — BAYER DCA HB A1C WAIVED: HB A1C (BAYER DCA - WAIVED): 7.2 % — ABNORMAL HIGH (ref <7.0)

## 2016-09-30 MED ORDER — FLUCONAZOLE 150 MG PO TABS: 150.0000 mg | ORAL_TABLET | ORAL | 4 refills | Status: DC

## 2016-09-30 MED ORDER — ALPRAZOLAM 1 MG PO TABS: 1.0000 mg | ORAL_TABLET | Freq: Three times a day (TID) | ORAL | 0 refills | Status: DC

## 2016-09-30 MED ORDER — OXYCODONE HCL 30 MG PO TABS: 30.0000 mg | ORAL_TABLET | ORAL | 0 refills | Status: DC | PRN

## 2016-09-30 MED ORDER — TIZANIDINE HCL 6 MG PO CAPS: 6.0000 mg | ORAL_CAPSULE | Freq: Three times a day (TID) | ORAL | 5 refills | Status: DC

## 2016-09-30 MED ORDER — OXYCODONE HCL 30 MG PO TABS: 30.0000 mg | ORAL_TABLET | Freq: Four times a day (QID) | ORAL | 0 refills | Status: DC

## 2016-09-30 MED ORDER — LIRAGLUTIDE 18 MG/3ML ~~LOC~~ SOPN: 1.8000 mg | PEN_INJECTOR | Freq: Every day | SUBCUTANEOUS | 11 refills | Status: DC

## 2016-09-30 MED ORDER — OXYCODONE HCL 30 MG PO TABS: 30.0000 mg | ORAL_TABLET | Freq: Four times a day (QID) | ORAL | 0 refills | Status: DC | PRN

## 2016-09-30 NOTE — Patient Instructions (Signed)

## 2016-10-01 ENCOUNTER — Telehealth: Payer: Self-pay | Admitting: Physician Assistant

## 2016-10-02 ENCOUNTER — Other Ambulatory Visit: Payer: Self-pay | Admitting: Physician Assistant

## 2016-10-03 NOTE — Progress Notes (Signed)
BP (!) 144/84   Pulse 81   Temp 98 F (36.7 C) (Oral)   Ht '5\' 4"'  (1.626 m)   Wt 157 lb 12.8 oz (71.6 kg)   BMI 27.09 kg/m    Subjective:    Patient ID: Caitlin Pennington, female    DOB: Jan 20, 1951, 66 y.o.   MRN: [id number]  HPI: Caitlin Pennington is a 66 y.o. female presenting on 09/30/2016 for Discuss eye surgery  Patient has long-standing insulin-dependent diabetes. She has known macular degeneration she has had the previous surgery on both eyes. The right eye daily. Her ophthalmologist wants to repeat surgery. She had a very difficult time dealing with this. Caused her cervical and lumbar degenerative disc to be extremely worsened. All of her medical conditions and medications are reviewed today. We will review labs when they return. We will also have refills updated. She'll be having surgery very soon and we expect follow-up in the next 3-4 months for her.   Past Medical History:  Diagnosis Date  . Anxiety   . Back pain   . Diabetes mellitus without complication ([place])   . Hyperlipidemia   . Hypertension   . Neuropathy ([place])    Relevant past medical, surgical, family and social history reviewed and updated as indicated. Interim medical history since our last visit reviewed. Allergies and medications reviewed and updated. DATA REVIEWED: CHART IN EPIC  Social History   Social History  . Marital status: Married    Spouse name: N/A  . Number of children: N/A  . Years of education: N/A   Occupational History  . Not on file.   Social History Main Topics  . Smoking status: Never Smoker  . Smokeless tobacco: Never Used  . Alcohol use No  . Drug use: No  . Sexual activity: Not on file   Other Topics Concern  . Not on file   Social History Narrative  . No narrative on file    Past Surgical History:  Procedure Laterality Date  . ABDOMINAL HYSTERECTOMY    . CHOLECYSTECTOMY    . TONSILLECTOMY AND ADENOIDECTOMY      Family History  Problem Relation Age of Onset  .  Congestive Heart Failure Mother   . Diabetes Mother   . Heart disease Father     Review of Systems  Constitutional: Positive for fatigue. Negative for activity change and fever.  HENT: Negative.   Eyes: Positive for visual disturbance.  Respiratory: Negative.  Negative for cough.   Cardiovascular: Negative.  Negative for chest pain.  Gastrointestinal: Negative.  Negative for abdominal pain.  Endocrine: Negative.   Genitourinary: Negative.  Negative for dysuria.  Musculoskeletal: Negative.   Skin: Negative.   Neurological: Positive for headaches. Negative for tremors.    Allergies as of 09/30/2016      Reactions   Demerol [meperidine] Swelling   Stadol [butorphanol] Swelling      Medication List       Accurate as of 09/30/16 11:59 PM. Always use your most recent med list.          ALPRAZolam 1 MG tablet Commonly known as:  XANAX Take 1 tablet (1 mg total) by mouth 3 (three) times daily.   cetirizine 10 MG tablet Commonly known as:  ZYRTEC TAKE 1 TABLET BY MOUTH AS NEEDED ONCE A DAY   fenofibrate micronized 134 MG capsule Commonly known as:  LOFIBRA Take 1 capsule (134 mg total) by mouth daily before breakfast.   fluconazole 150 MG tablet Commonly known  as:  DIFLUCAN Take 1 tablet (150 mg total) by mouth once a week.   gabapentin 800 MG tablet Commonly known as:  NEURONTIN Take 1 tablet (800 mg total) by mouth 3 (three) times daily.   GLOBAL EASE INJECT PEN NEEDLES 32G X 4 MM Misc Generic drug:  Insulin Pen Needle USE WITH INSULIN PENS UP TO 5 TIMES DAILY (BILL INSULIN FIRST FOR A $0 COPAY)   hydrochlorothiazide 25 MG tablet Commonly known as:  HYDRODIURIL TAKE 1 TABLET BY MOUTH DAILY   insulin aspart 100 UNIT/ML injection Commonly known as:  novoLOG Inject 20 Units into the skin 3 (three) times daily before meals.   LANTUS SOLOSTAR 100 UNIT/ML Solostar Pen Generic drug:  Insulin Glargine Inject 30 Units into the skin at bedtime.   liraglutide 18  MG/3ML Sopn Commonly known as:  VICTOZA Inject 0.3 mLs (1.8 mg total) into the skin daily.   meloxicam 7.5 MG tablet Commonly known as:  MOBIC Take 1 tablet (7.5 mg total) by mouth daily.   oxycodone 30 MG immediate release tablet Commonly known as:  ROXICODONE Take 1 tablet (30 mg total) by mouth every 6 (six) hours as needed for pain.   oxycodone 30 MG immediate release tablet Commonly known as:  ROXICODONE Take 1 tablet (30 mg total) by mouth every 6 (six) hours.   oxycodone 30 MG immediate release tablet Commonly known as:  ROXICODONE Take 1 tablet (30 mg total) by mouth every 4 (four) hours as needed for pain.   pantoprazole 40 MG tablet Commonly known as:  PROTONIX Take 40 mg by mouth daily.   tizanidine 6 MG capsule Commonly known as:  ZANAFLEX Take 1 capsule (6 mg total) by mouth 3 (three) times daily.          Objective:    BP (!) 144/84   Pulse 81   Temp 98 F (36.7 C) (Oral)   Ht '5\' 4"'  (1.626 m)   Wt 157 lb 12.8 oz (71.6 kg)   BMI 27.09 kg/m   Allergies  Allergen Reactions  . Demerol [Meperidine] Swelling  . Stadol [Butorphanol] Swelling    Wt Readings from Last 3 Encounters:  09/30/16 157 lb 12.8 oz (71.6 kg)  06/30/16 162 lb (73.5 kg)  05/03/16 157 lb 9.6 oz (71.5 kg)    Physical Exam  Constitutional: She is oriented to person, place, and time. She appears well-developed and well-nourished.  HENT:  Head: Normocephalic and atraumatic.  Right Ear: Tympanic membrane, external ear and ear canal normal.  Left Ear: Tympanic membrane, external ear and ear canal normal.  Nose: Nose normal. No rhinorrhea.  Mouth/Throat: Oropharynx is clear and moist and mucous membranes are normal. No oropharyngeal exudate or posterior oropharyngeal erythema.  Eyes: Conjunctivae and EOM are normal. Pupils are equal, round, and reactive to light.  Neck: Normal range of motion. Neck supple.  Cardiovascular: Normal rate, regular rhythm, normal heart sounds and intact  distal pulses.   Pulmonary/Chest: Effort normal and breath sounds normal.  Abdominal: Soft. Bowel sounds are normal.  Neurological: She is alert and oriented to person, place, and time. She has normal reflexes.  Skin: Skin is warm and dry. No rash noted.  Psychiatric: She has a normal mood and affect. Her behavior is normal. Judgment and thought content normal.    Results for orders placed or performed in visit on 09/30/16  CMP14+EGFR  Result Value Ref Range   Glucose 143 (H) 65 - 99 mg/dL   BUN 13 8 -  27 mg/dL   Creatinine, Ser 0.69 0.57 - 1.00 mg/dL   GFR calc non Af Amer 91 >59 mL/min/1.73   GFR calc Af Amer 105 >59 mL/min/1.73   BUN/Creatinine Ratio 19 12 - 28   Sodium 139 134 - 144 mmol/L   Potassium 4.5 3.5 - 5.2 mmol/L   Chloride 97 96 - 106 mmol/L   CO2 26 18 - 29 mmol/L   Calcium 10.2 8.7 - 10.3 mg/dL   Total Protein 7.6 6.0 - 8.5 g/dL   Albumin 4.6 3.6 - 4.8 g/dL   Globulin, Total 3.0 1.5 - 4.5 g/dL   Albumin/Globulin Ratio 1.5 1.2 - 2.2   Bilirubin Total 0.3 0.0 - 1.2 mg/dL   Alkaline Phosphatase 68 39 - 117 IU/L   AST 18 0 - 40 IU/L   ALT 17 0 - 32 IU/L  Bayer DCA Hb A1c Waived  Result Value Ref Range   Bayer DCA Hb A1c Waived 7.2 (H) <7.0 %      Assessment & Plan:   1. 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 Background diabetic retinopathy ([place])  5. Mixed hyperlipidemia  6. Cervical disc disease - oxycodone (ROXICODONE) 30 MG immediate release tablet; Take 1 tablet (30 mg total) by mouth every 6 (six) hours as needed for pain.  Dispense: 120 tablet; Refill: 0 - oxycodone (ROXICODONE) 30 MG immediate release tablet; Take 1 tablet (30 mg total) by mouth every 6 (six) hours.  Dispense: 120 tablet; Refill: 0 - tizanidine (ZANAFLEX) 6 MG capsule; Take 1 capsule (6 mg total) by mouth 3 (three) times daily.  Dispense: 90 capsule; Refill: 5  7. Degeneration of  lumbar or lumbosacral intervertebral disc - oxycodone (ROXICODONE) 30 MG immediate release tablet; Take 1 tablet (30 mg total) by mouth every 6 (six) hours as needed for pain.  Dispense: 120 tablet; Refill: 0 - oxycodone (ROXICODONE) 30 MG immediate release tablet; Take 1 tablet (30 mg total) by mouth every 6 (six) hours.  Dispense: 120 tablet; Refill: 0 - oxycodone (ROXICODONE) 30 MG immediate release tablet; Take 1 tablet (30 mg total) by mouth every 4 (four) hours as needed for pain.  Dispense: 120 tablet; Refill: 0 - tizanidine (ZANAFLEX) 6 MG capsule; Take 1 capsule (6 mg total) by mouth 3 (three) times daily.  Dispense: 90 capsule; Refill: 5  8. Pain in joint involving multiple sites - oxycodone (ROXICODONE) 30 MG immediate release tablet; Take 1 tablet (30 mg total) by mouth every 4 (four) hours as needed for pain.  Dispense: 120 tablet; Refill: 0  9. Generalized anxiety disorder - ALPRAZolam (XANAX) 1 MG tablet; Take 1 tablet (1 mg total) by mouth 3 (three) times daily.  Dispense: 60 tablet; Refill: 0  10. 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  Dispense: 6 mL; Refill: 11 - fluconazole (DIFLUCAN) 150 MG tablet; Take 1 tablet (150 mg total) by mouth once a week.  Dispense: 4 tablet; Refill: 4 - Bayer DCA Hb A1c Waived   Continue all other maintenance medications as listed above.  Follow up plan: Follow up 3-4 months  Orders Placed This Encounter  Procedures  . CMP14+EGFR  . Bayer [facility] Hb A1c [employer] given for macular degeneration  [employer]  Adah Salvage PA-C [place] [address]  [place], [place] [address] [phone]   10/03/2016, 8:48 PM

## 2016-10-29 ENCOUNTER — Other Ambulatory Visit: Payer: Self-pay | Admitting: Physician Assistant

## 2016-10-29 DIAGNOSIS — F411 Generalized anxiety disorder: Secondary | ICD-10-CM

## 2016-11-30 ENCOUNTER — Ambulatory Visit (INDEPENDENT_AMBULATORY_CARE_PROVIDER_SITE_OTHER): Payer: BLUE CROSS/BLUE SHIELD | Admitting: Physician Assistant

## 2016-11-30 ENCOUNTER — Encounter: Payer: Self-pay | Admitting: Physician Assistant

## 2016-11-30 VITALS — BP 152/79 | HR 86 | Temp 97.7°F | Ht 64.0 in | Wt 162.0 lb

## 2016-11-30 DIAGNOSIS — F411 Generalized anxiety disorder: Secondary | ICD-10-CM | POA: Diagnosis not present

## 2016-11-30 DIAGNOSIS — H35723 Serous detachment of retinal pigment epithelium, bilateral: Secondary | ICD-10-CM

## 2016-11-30 DIAGNOSIS — M2550 Pain in unspecified joint: Secondary | ICD-10-CM

## 2016-11-30 DIAGNOSIS — M5090 Cervical disc disorder, unspecified, unspecified cervical region: Secondary | ICD-10-CM

## 2016-11-30 DIAGNOSIS — M5137 Other intervertebral disc degeneration, lumbosacral region: Secondary | ICD-10-CM | POA: Diagnosis not present

## 2016-11-30 DIAGNOSIS — E1039 Type 1 diabetes mellitus with other diabetic ophthalmic complication: Secondary | ICD-10-CM | POA: Diagnosis not present

## 2016-11-30 MED ORDER — OXYCODONE HCL 30 MG PO TABS: 30.0000 mg | ORAL_TABLET | Freq: Four times a day (QID) | ORAL | 0 refills | Status: DC | PRN

## 2016-11-30 MED ORDER — OXYCODONE HCL 30 MG PO TABS: 30.0000 mg | ORAL_TABLET | Freq: Four times a day (QID) | ORAL | 0 refills | Status: DC

## 2016-11-30 MED ORDER — OXYCODONE HCL 30 MG PO TABS: 30.0000 mg | ORAL_TABLET | ORAL | 0 refills | Status: DC | PRN

## 2016-11-30 MED ORDER — ALPRAZOLAM 1 MG PO TABS: 1.0000 mg | ORAL_TABLET | Freq: Three times a day (TID) | ORAL | 2 refills | Status: DC

## 2016-11-30 MED ORDER — LANTUS SOLOSTAR 100 UNIT/ML ~~LOC~~ SOPN: 30.0000 [IU] | PEN_INJECTOR | Freq: Every day | SUBCUTANEOUS | 12 refills | Status: DC

## 2016-11-30 MED ORDER — GABAPENTIN 800 MG PO TABS: 800.0000 mg | ORAL_TABLET | Freq: Four times a day (QID) | ORAL | 11 refills | Status: DC

## 2016-11-30 NOTE — Patient Instructions (Signed)
In a few days you may receive a survey in the mail or online from [employer] regarding your visit with us today. Please take a moment to fill this out. Your feedback is very important to our whole office. It can help us better understand your needs as well as improve your experience and satisfaction. Thank you for taking your time to complete it. We care about you.  Qaadir Kent, PA-C  

## 2016-11-30 NOTE — Progress Notes (Signed)
BP (!) 152/79   Pulse 86   Temp 97.7 F (36.5 C) (Oral)   Ht 5\' 4"  (1.626 m)   Wt 162 lb (73.5 kg)   BMI 27.81 kg/m    Subjective:    Patient ID: Caitlin Pennington, female    DOB: 11-27-1950, 66 y.o.   MRN: [id number]  HPI: Caitlin Pennington is a 66 y.o. female presenting on 11/30/2016 for Headache and Back Pain (known DDD, had macular surgery and post surgical chair has her prine and always exacerbates her back)  This patient comes in for periodic recheck on medications and conditions including Degenerative disc disease, macular tear postrepair, type 1 diabetes, headache. The patient comes in for recheck. She had her surgery a few weeks ago. She has to sleep in a prone chair for several weeks after she has the macular repair. This always exacerbates her back tremendously. She was out for several days related to her back bothering her. It was hurting throughout her lumbar area and up into the cervical spine and even giving her more headaches than normal.   All medications are reviewed today. There are no reports of any problems with the medications. All of the medical conditions are reviewed and updated.  Lab work is reviewed and will be ordered as medically necessary. There are no new problems reported with today's visit.   Relevant past medical, surgical, family and social history reviewed and updated as indicated. Allergies and medications reviewed and updated.  Past Medical History:  Diagnosis Date  . Anxiety   . Back pain   . Diabetes mellitus without complication (HCC)   . Hyperlipidemia   . Hypertension   . Neuropathy [facility])     Past Surgical History:  Procedure Laterality Date  . ABDOMINAL HYSTERECTOMY    . CHOLECYSTECTOMY    . TONSILLECTOMY AND ADENOIDECTOMY      Review of Systems  Constitutional: Positive for fatigue. Negative for activity change and fever.  HENT: Negative.   Eyes: Negative.   Respiratory: Negative.  Negative for cough.   Cardiovascular: Negative.   Negative for chest pain.  Gastrointestinal: Negative.  Negative for abdominal pain.  Endocrine: Negative.   Genitourinary: Negative.  Negative for dysuria.  Musculoskeletal: Positive for arthralgias, back pain, gait problem and myalgias.  Skin: Negative.   Neurological: Positive for headaches. Negative for seizures and weakness.    Allergies as of 11/30/2016      Reactions   Demerol [meperidine] Swelling   Stadol [butorphanol] Swelling      Medication List       Accurate as of 11/30/16  5:39 PM. Always use your most recent med list.          ALPRAZolam 1 MG tablet Commonly known as:  XANAX Take 1 tablet (1 mg total) by mouth 3 (three) times daily.   cetirizine 10 MG tablet Commonly known as:  ZYRTEC TAKE 1 TABLET BY MOUTH AS NEEDED ONCE A DAY   fenofibrate micronized 134 MG capsule Commonly known as:  LOFIBRA Take 1 capsule (134 mg total) by mouth daily before breakfast.   fluconazole 150 MG tablet Commonly known as:  DIFLUCAN Take 1 tablet (150 mg total) by mouth once a week.   gabapentin 800 MG tablet Commonly known as:  NEURONTIN Take 1 tablet (800 mg total) by mouth 4 (four) times daily.   GLOBAL EASE INJECT PEN NEEDLES 32G X 4 MM Misc Generic drug:  Insulin Pen Needle USE WITH INSULIN PENS UP TO 5 TIMES DAILY (  BILL INSULIN FIRST FOR A $0 COPAY)   hydrochlorothiazide 25 MG tablet Commonly known as:  HYDRODIURIL TAKE 1 TABLET BY MOUTH DAILY   insulin aspart 100 UNIT/ML injection Commonly known as:  novoLOG Inject 20 Units into the skin 3 (three) times daily before meals.   NOVOLOG FLEXPEN 100 UNIT/ML FlexPen Generic drug:  insulin aspart INJECT 10-20 UNITS SUB-Q BEFORE MEALS   LANTUS SOLOSTAR 100 UNIT/ML Solostar Pen Generic drug:  Insulin Glargine Inject 30 Units into the skin at bedtime.   liraglutide 18 MG/3ML Sopn Commonly known as:  VICTOZA Inject 0.3 mLs (1.8 mg total) into the skin daily.   meloxicam 7.5 MG tablet Commonly known as:   MOBIC Take 1 tablet (7.5 mg total) by mouth daily.   oxycodone 30 MG immediate release tablet Commonly known as:  ROXICODONE Take 1 tablet (30 mg total) by mouth every 4 (four) hours as needed for pain.   oxycodone 30 MG immediate release tablet Commonly known as:  ROXICODONE Take 1 tablet (30 mg total) by mouth every 6 (six) hours.   oxycodone 30 MG immediate release tablet Commonly known as:  ROXICODONE Take 1 tablet (30 mg total) by mouth every 6 (six) hours as needed for pain.   pantoprazole 40 MG tablet Commonly known as:  PROTONIX Take 40 mg by mouth daily.   tizanidine 6 MG capsule Commonly known as:  ZANAFLEX Take 1 capsule (6 mg total) by mouth 3 (three) times daily.          Objective:    BP (!) 152/79   Pulse 86   Temp 97.7 F (36.5 C) (Oral)   Ht 5\' 4"  (1.626 m)   Wt 162 lb (73.5 kg)   BMI 27.81 kg/m   Allergies  Allergen Reactions  . Demerol [Meperidine] Swelling  . Stadol [Butorphanol] Swelling    Physical Exam  Constitutional: She is oriented to person, place, and time. She appears well-developed and well-nourished.  HENT:  Head: Normocephalic and atraumatic.  Eyes: Conjunctivae and EOM are normal. Pupils are equal, round, and reactive to light.  Cardiovascular: Normal rate, regular rhythm, normal heart sounds and intact distal pulses.   Pulmonary/Chest: Effort normal and breath sounds normal.  Abdominal: Soft. Bowel sounds are normal.  Musculoskeletal:       Cervical back: She exhibits decreased range of motion, pain and spasm.       Lumbar back: She exhibits decreased range of motion, tenderness, pain and spasm.  Neurological: She is alert and oriented to person, place, and time. She has normal reflexes.  Skin: Skin is warm and dry. No rash noted.  Psychiatric: She has a normal mood and affect. Her behavior is normal. Judgment and thought content normal.        Assessment & Plan:   1. Degeneration of lumbar or lumbosacral intervertebral  disc - gabapentin (NEURONTIN) 800 MG tablet; Take 1 tablet (800 mg total) by mouth 4 (four) times daily.  Dispense: 120 tablet; Refill: 11 - oxycodone (ROXICODONE) 30 MG immediate release tablet; Take 1 tablet (30 mg total) by mouth every 4 (four) hours as needed for pain.  Dispense: 120 tablet; Refill: 0 - oxycodone (ROXICODONE) 30 MG immediate release tablet; Take 1 tablet (30 mg total) by mouth every 6 (six) hours.  Dispense: 120 tablet; Refill: 0 - oxycodone (ROXICODONE) 30 MG immediate release tablet; Take 1 tablet (30 mg total) by mouth every 6 (six) hours as needed for pain.  Dispense: 120 tablet; Refill: 0  2.  Cervical disc disease - gabapentin (NEURONTIN) 800 MG tablet; Take 1 tablet (800 mg total) by mouth 4 (four) times daily.  Dispense: 120 tablet; Refill: 11 - oxycodone (ROXICODONE) 30 MG immediate release tablet; Take 1 tablet (30 mg total) by mouth every 6 (six) hours.  Dispense: 120 tablet; Refill: 0 - oxycodone (ROXICODONE) 30 MG immediate release tablet; Take 1 tablet (30 mg total) by mouth every 6 (six) hours as needed for pain.  Dispense: 120 tablet; Refill: 0  3. 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  Dispense: 15 mL; Refill: 12  5. Pain in joint involving multiple sites - oxycodone (ROXICODONE) 30 MG immediate release tablet; Take 1 tablet (30 mg total) by mouth every 4 (four) hours as needed for pain.  Dispense: 120 tablet; Refill: 0  6. Generalized anxiety disorder - ALPRAZolam (XANAX) 1 MG tablet; Take 1 tablet (1 mg total) by mouth 3 (three) times daily.  Dispense: 90 tablet; Refill: 2   Current Outpatient Prescriptions:  .  ALPRAZolam (XANAX) 1 MG tablet, Take 1 tablet (1 mg total) by mouth 3 (three) times daily., Disp: 90 tablet, Rfl: 2 .  cetirizine (ZYRTEC) 10 MG tablet, TAKE 1 TABLET BY MOUTH AS  NEEDED ONCE A DAY, Disp: 30 tablet, Rfl: 5 .  fenofibrate micronized (LOFIBRA) 134 MG capsule, Take 1 capsule (134 mg total) by mouth daily before breakfast., Disp: 30 capsule, Rfl: 11 .  fluconazole (DIFLUCAN) 150 MG tablet, Take 1 tablet (150 mg total) by mouth once a week., Disp: 4 tablet, Rfl: 4 .  gabapentin (NEURONTIN) 800 MG tablet, Take 1 tablet (800 mg total) by mouth 4 (four) times daily., Disp: 120 tablet, Rfl: 11 .  GLOBAL EASE INJECT PEN NEEDLES 32G X 4 MM MISC, USE WITH INSULIN PENS UP TO 5 TIMES DAILY (BILL INSULIN FIRST FOR A $0 COPAY), Disp: , Rfl: 12 .  hydrochlorothiazide (HYDRODIURIL) 25 MG tablet, TAKE 1 TABLET BY MOUTH DAILY, Disp: 30 tablet, Rfl: 2 .  insulin aspart (NOVOLOG) 100 UNIT/ML injection, Inject 20 Units into the skin 3 (three) times daily before meals., Disp: , Rfl:  .  LANTUS SOLOSTAR 100 UNIT/ML Solostar Pen, Inject 30 Units into the skin at bedtime., Disp: 15 mL, Rfl: 12 .  liraglutide (VICTOZA) 18 MG/3ML SOPN, Inject 0.3 mLs (1.8 mg total) into the skin daily., Disp: 6 mL, Rfl: 11 .  meloxicam (MOBIC) 7.5 MG tablet, Take 1 tablet (7.5 mg total) by mouth daily., Disp: 30 tablet, Rfl: 11 .  NOVOLOG FLEXPEN 100 UNIT/ML FlexPen, INJECT 10-20 UNITS SUB-Q BEFORE MEALS, Disp: 15 mL, Rfl: 2 .  oxycodone (ROXICODONE) 30 MG immediate release tablet, Take 1 tablet (30 mg total) by mouth every 4 (four) hours as needed for pain., Disp: 120 tablet, Rfl: 0 .  oxycodone (ROXICODONE) 30 MG immediate release tablet, Take 1 tablet (30 mg total) by mouth every 6 (six) hours., Disp: 120 tablet, Rfl: 0 .  oxycodone (ROXICODONE) 30 MG immediate release tablet, Take 1 tablet (30 mg total) by mouth every 6 (six) hours as needed for pain., Disp: 120 tablet, Rfl: 0 .  pantoprazole (PROTONIX) 40 MG tablet, Take 40 mg by mouth daily., Disp: , Rfl: 99 .  tizanidine (ZANAFLEX) 6 MG capsule, Take 1 capsule (6 mg total) by mouth 3 (three) times daily., Disp: 90 capsule, Rfl: 5  Continue  all  other maintenance medications as listed above.  Follow up plan: Return in about 3 months (around 03/02/2017) for 3 month recheck due. OUT of work 3/16-3/23/18.  Educational handout given for DDD, back pain  Remus Loffler PA-C [facility] [address]  [place], [place] [address] [phone]   11/30/2016, 5:39 PM

## 2017-01-05 ENCOUNTER — Other Ambulatory Visit: Payer: Self-pay | Admitting: Family Medicine

## 2017-01-05 ENCOUNTER — Telehealth: Payer: Self-pay | Admitting: Physician Assistant

## 2017-01-05 DIAGNOSIS — Z029 Encounter for administrative examinations, unspecified: Secondary | ICD-10-CM

## 2017-01-05 NOTE — Telephone Encounter (Signed)
Per DPR left voicemail that handicap placard paperwork was completed and up front for her to pick up.

## 2017-01-05 NOTE — Telephone Encounter (Signed)
Prepared and will,be up front

## 2017-01-19 ENCOUNTER — Other Ambulatory Visit: Payer: Self-pay | Admitting: Physician Assistant

## 2017-01-25 ENCOUNTER — Other Ambulatory Visit: Payer: Self-pay | Admitting: Physician Assistant

## 2017-01-25 DIAGNOSIS — F411 Generalized anxiety disorder: Secondary | ICD-10-CM

## 2017-01-26 NOTE — Telephone Encounter (Signed)
Phoned in.

## 2017-02-01 ENCOUNTER — Other Ambulatory Visit: Payer: Self-pay | Admitting: Physician Assistant

## 2017-02-03 ENCOUNTER — Telehealth: Payer: Self-pay | Admitting: Physician Assistant

## 2017-02-03 NOTE — Telephone Encounter (Signed)
Scheduled

## 2017-02-07 DIAGNOSIS — Z0289 Encounter for other administrative examinations: Secondary | ICD-10-CM

## 2017-02-08 ENCOUNTER — Encounter: Payer: Self-pay | Admitting: Physician Assistant

## 2017-02-08 LAB — HM DIABETES EYE EXAM

## 2017-02-15 ENCOUNTER — Telehealth: Payer: Self-pay | Admitting: Physician Assistant

## 2017-02-15 MED ORDER — METRONIDAZOLE 500 MG PO TABS: 500.0000 mg | ORAL_TABLET | Freq: Three times a day (TID) | ORAL | 0 refills | Status: DC

## 2017-02-15 NOTE — Telephone Encounter (Signed)
Please advise and route to Pool A 

## 2017-02-15 NOTE — Telephone Encounter (Signed)
Patient aware medication has been sent to pharmacy.

## 2017-02-19 ENCOUNTER — Other Ambulatory Visit: Payer: Self-pay | Admitting: Physician Assistant

## 2017-02-19 DIAGNOSIS — E10311 Type 1 diabetes mellitus with unspecified diabetic retinopathy with macular edema: Secondary | ICD-10-CM

## 2017-02-21 ENCOUNTER — Other Ambulatory Visit: Payer: Self-pay | Admitting: Physician Assistant

## 2017-02-25 ENCOUNTER — Other Ambulatory Visit: Payer: Self-pay | Admitting: Physician Assistant

## 2017-02-28 NOTE — Telephone Encounter (Signed)
Last seen 11/30/16  Caitlin Pennington  Last lipid 05/03/16

## 2017-03-03 ENCOUNTER — Other Ambulatory Visit: Payer: Self-pay | Admitting: Physician Assistant

## 2017-03-16 DIAGNOSIS — Z885 Allergy status to narcotic agent status: Secondary | ICD-10-CM | POA: Diagnosis not present

## 2017-03-16 DIAGNOSIS — E785 Hyperlipidemia, unspecified: Secondary | ICD-10-CM | POA: Diagnosis not present

## 2017-03-16 DIAGNOSIS — I10 Essential (primary) hypertension: Secondary | ICD-10-CM | POA: Diagnosis not present

## 2017-03-16 DIAGNOSIS — E119 Type 2 diabetes mellitus without complications: Secondary | ICD-10-CM | POA: Diagnosis not present

## 2017-03-16 DIAGNOSIS — M5442 Lumbago with sciatica, left side: Secondary | ICD-10-CM | POA: Diagnosis not present

## 2017-03-16 DIAGNOSIS — Z7982 Long term (current) use of aspirin: Secondary | ICD-10-CM | POA: Diagnosis not present

## 2017-03-16 DIAGNOSIS — M4726 Other spondylosis with radiculopathy, lumbar region: Secondary | ICD-10-CM | POA: Diagnosis not present

## 2017-03-23 ENCOUNTER — Ambulatory Visit (INDEPENDENT_AMBULATORY_CARE_PROVIDER_SITE_OTHER): Payer: BLUE CROSS/BLUE SHIELD | Admitting: Physician Assistant

## 2017-03-23 ENCOUNTER — Encounter: Payer: Self-pay | Admitting: Physician Assistant

## 2017-03-23 VITALS — BP 138/84 | HR 93 | Temp 97.2°F | Ht 64.0 in | Wt 161.6 lb

## 2017-03-23 DIAGNOSIS — M5137 Other intervertebral disc degeneration, lumbosacral region: Secondary | ICD-10-CM | POA: Diagnosis not present

## 2017-03-23 DIAGNOSIS — M2550 Pain in unspecified joint: Secondary | ICD-10-CM | POA: Diagnosis not present

## 2017-03-23 DIAGNOSIS — N760 Acute vaginitis: Secondary | ICD-10-CM | POA: Diagnosis not present

## 2017-03-23 DIAGNOSIS — M5090 Cervical disc disorder, unspecified, unspecified cervical region: Secondary | ICD-10-CM

## 2017-03-23 DIAGNOSIS — M5432 Sciatica, left side: Secondary | ICD-10-CM | POA: Diagnosis not present

## 2017-03-23 DIAGNOSIS — F411 Generalized anxiety disorder: Secondary | ICD-10-CM | POA: Diagnosis not present

## 2017-03-23 MED ORDER — METRONIDAZOLE 500 MG PO TABS: 500.0000 mg | ORAL_TABLET | Freq: Three times a day (TID) | ORAL | 0 refills | Status: DC

## 2017-03-23 MED ORDER — MELOXICAM 15 MG PO TABS: 15.0000 mg | ORAL_TABLET | Freq: Every day | ORAL | 1 refills | Status: DC

## 2017-03-23 MED ORDER — OXYCODONE HCL 30 MG PO TABS: 30.0000 mg | ORAL_TABLET | Freq: Four times a day (QID) | ORAL | 0 refills | Status: DC

## 2017-03-23 MED ORDER — ALPRAZOLAM 1 MG PO TABS: 1.0000 mg | ORAL_TABLET | Freq: Three times a day (TID) | ORAL | 2 refills | Status: DC

## 2017-03-23 MED ORDER — METHYLPREDNISOLONE ACETATE 80 MG/ML IJ SUSP
80.0000 mg | Freq: Once | INTRAMUSCULAR | Status: AC
  Administered 2017-03-23: 80 mg via INTRAMUSCULAR

## 2017-03-23 MED ORDER — OXYCODONE HCL 30 MG PO TABS: 30.0000 mg | ORAL_TABLET | Freq: Four times a day (QID) | ORAL | 0 refills | Status: DC | PRN

## 2017-03-23 MED ORDER — OXYCODONE HCL 30 MG PO TABS: 30.0000 mg | ORAL_TABLET | ORAL | 0 refills | Status: DC | PRN

## 2017-03-23 NOTE — Patient Instructions (Signed)
In a few days you may receive a survey in the mail or online from [employer] regarding your visit with us today. Please take a moment to fill this out. Your feedback is very important to our whole office. It can help us better understand your needs as well as improve your experience and satisfaction. Thank you for taking your time to complete it. We care about you.  Kaylenn Civil, PA-C  

## 2017-03-23 NOTE — Progress Notes (Signed)
BP 138/84   Pulse 93   Temp (!) 97.2 F (36.2 C) (Oral)   Ht 5\' 4"  (1.626 m)   Wt 161 lb 9.6 oz (73.3 kg)   BMI 27.74 kg/m    Subjective:    Patient ID: Caitlin Pennington, female    DOB: [birth year], 66 y.o.   MRN: [id number]  HPI: Caitlin Pennington is a 66 y.o. female presenting on 03/23/2017 for Hypertension  This patient comes in for periodic recheck on medications and conditions including Degenerative disc disease, cervical disc disease, anxiety, diabetes. Patient has had a recent flareup of sciatica. She was just moving around slowly and out the pain in her lumbar area and down the left leg. She is not had any falls related to this. It will hurt all the way up the spine at times. She is also having a chronic vaginitis. She had taken some Diflucan and it seemed to dry up any yeast that she is still having copious discharge and odor..   All medications are reviewed today. There are no reports of any problems with the medications. All of the medical conditions are reviewed and updated.  Lab work is reviewed and will be ordered as medically necessary. There are no new problems reported with today's visit.   Relevant past medical, surgical, family and social history reviewed and updated as indicated. Allergies and medications reviewed and updated.  Past Medical History:  Diagnosis Date  . Anxiety   . Back pain   . Diabetes mellitus without complication (HCC)   . Hyperlipidemia   . Hypertension   . Neuropathy     Past Surgical History:  Procedure Laterality Date  . ABDOMINAL HYSTERECTOMY    . CHOLECYSTECTOMY    . TONSILLECTOMY AND ADENOIDECTOMY      Review of Systems  Constitutional: Negative.  Negative for activity change, fatigue and fever.  HENT: Negative.   Eyes: Negative.   Respiratory: Negative.  Negative for cough.   Cardiovascular: Negative.  Negative for chest pain.  Gastrointestinal: Negative.  Negative for abdominal pain.  Endocrine: Negative.   Genitourinary: Negative.   Negative for dysuria.  Musculoskeletal: Positive for arthralgias, back pain, gait problem and neck pain.  Skin: Negative.     Allergies as of 03/23/2017      Reactions   Demerol [meperidine] Swelling   Stadol [butorphanol] Swelling      Medication List       Accurate as of 03/23/17  4:20 PM. Always use your most recent med list.          ALPRAZolam 1 MG tablet Commonly known as:  XANAX Take 1 tablet (1 mg total) by mouth 3 (three) times daily.   atorvastatin 10 MG tablet Commonly known as:  LIPITOR TAKE 1 TABLET BY MOUTH ONCE A DAY   cetirizine 10 MG tablet Commonly known as:  ZYRTEC TAKE 1 TABLET BY MOUTH AS NEEDED ONCE A DAY   fenofibrate micronized 134 MG capsule Commonly known as:  LOFIBRA Take 1 capsule (134 mg total) by mouth daily before breakfast.   fluconazole 150 MG tablet Commonly known as:  DIFLUCAN TAKE 1 TABLET BY MOUTH ONCE A WEEK   gabapentin 800 MG tablet Commonly known as:  NEURONTIN Take 1 tablet (800 mg total) by mouth 4 (four) times daily.   GLOBAL EASE INJECT PEN NEEDLES 32G X 4 MM Misc Generic drug:  Insulin Pen Needle USE WITH INSULIN PENS UP TO 5 TIMES DAILY (BILL INSULIN FIRST FOR A $0 COPAY)  hydrochlorothiazide 25 MG tablet Commonly known as:  HYDRODIURIL TAKE 1 TABLET BY MOUTH DAILY   LANTUS SOLOSTAR 100 UNIT/ML Solostar Pen Generic drug:  Insulin Glargine Inject 30 Units into the skin at bedtime.   liraglutide 18 MG/3ML Sopn Commonly known as:  VICTOZA Inject 0.3 mLs (1.8 mg total) into the skin daily.   meloxicam 15 MG tablet Commonly known as:  MOBIC Take 1 tablet (15 mg total) by mouth daily.   metroNIDAZOLE 500 MG tablet Commonly known as:  FLAGYL Take 1 tablet (500 mg total) by mouth 3 (three) times daily.   NOVOLOG FLEXPEN 100 UNIT/ML FlexPen Generic drug:  insulin aspart INJECT 10-20 UNITS UNDER SKIN BEFORE MEALS   oxycodone 30 MG immediate release tablet Commonly known as:  ROXICODONE Take 1 tablet (30 mg  total) by mouth every 4 (four) hours as needed for pain.   oxycodone 30 MG immediate release tablet Commonly known as:  ROXICODONE Take 1 tablet (30 mg total) by mouth every 6 (six) hours.   oxycodone 30 MG immediate release tablet Commonly known as:  ROXICODONE Take 1 tablet (30 mg total) by mouth every 6 (six) hours as needed for pain.   pantoprazole 40 MG tablet Commonly known as:  PROTONIX TAKE 1 TABLET BY MOUTH EVERY DAY   tizanidine 6 MG capsule Commonly known as:  ZANAFLEX Take 1 capsule (6 mg total) by mouth 3 (three) times daily.          Objective:    BP 138/84   Pulse 93   Temp (!) 97.2 F (36.2 C) (Oral)   Ht 5\' 4"  (1.626 m)   Wt 161 lb 9.6 oz (73.3 kg)   BMI 27.74 kg/m   Allergies  Allergen Reactions  . Demerol [Meperidine] Swelling  . Stadol [Butorphanol] Swelling    Physical Exam  Constitutional: She is oriented to person, place, and time. She appears well-developed and well-nourished.  HENT:  Head: Normocephalic and atraumatic.  Right Ear: Tympanic membrane, external ear and ear canal normal.  Left Ear: Tympanic membrane, external ear and ear canal normal.  Nose: Nose normal. No rhinorrhea.  Mouth/Throat: Oropharynx is clear and moist and mucous membranes are normal. No oropharyngeal exudate or posterior oropharyngeal erythema.  Eyes: Pupils are equal, round, and reactive to light. Conjunctivae and EOM are normal.  Neck: Normal range of motion. Neck supple.  Cardiovascular: Normal rate, regular rhythm, normal heart sounds and intact distal pulses.   Pulmonary/Chest: Effort normal and breath sounds normal.  Abdominal: Soft. Bowel sounds are normal.  Musculoskeletal:       Lumbar back: She exhibits decreased range of motion, tenderness, pain and spasm.       Back:  Neurological: She is alert and oriented to person, place, and time. She has normal reflexes.  Skin: Skin is warm and dry. No rash noted.  Psychiatric: She has a normal mood and affect.  Her behavior is normal. Judgment and thought content normal.    Results for orders placed or performed in visit on 02/08/17  HM DIABETES EYE EXAM  Result Value Ref Range   HM Diabetic Eye Exam  No Retinopathy      Assessment & Plan:   1. Sciatica of left side - methylPREDNISolone acetate (DEPO-MEDROL) injection 80 mg; Inject 1 mL (80 mg total) into the muscle once.  2. Pain in joint involving multiple sites - oxycodone (ROXICODONE) 30 MG immediate release tablet; Take 1 tablet (30 mg total) by mouth every 4 (four) hours as  needed for pain.  Dispense: 120 tablet; Refill: 0  3. Degeneration of lumbar or lumbosacral intervertebral disc - oxycodone (ROXICODONE) 30 MG immediate release tablet; Take 1 tablet (30 mg total) by mouth every 4 (four) hours as needed for pain.  Dispense: 120 tablet; Refill: 0 - oxycodone (ROXICODONE) 30 MG immediate release tablet; Take 1 tablet (30 mg total) by mouth every 6 (six) hours.  Dispense: 120 tablet; Refill: 0 - oxycodone (ROXICODONE) 30 MG immediate release tablet; Take 1 tablet (30 mg total) by mouth every 6 (six) hours as needed for pain.  Dispense: 120 tablet; Refill: 0 - meloxicam (MOBIC) 15 MG tablet; Take 1 tablet (15 mg total) by mouth daily.  Dispense: 90 tablet; Refill: 1  4. Cervical disc disease - oxycodone (ROXICODONE) 30 MG immediate release tablet; Take 1 tablet (30 mg total) by mouth every 6 (six) hours.  Dispense: 120 tablet; Refill: 0 - oxycodone (ROXICODONE) 30 MG immediate release tablet; Take 1 tablet (30 mg total) by mouth every 6 (six) hours as needed for pain.  Dispense: 120 tablet; Refill: 0 - meloxicam (MOBIC) 15 MG tablet; Take 1 tablet (15 mg total) by mouth daily.  Dispense: 90 tablet; Refill: 1  5. Generalized anxiety disorder - ALPRAZolam (XANAX) 1 MG tablet; Take 1 tablet (1 mg total) by mouth 3 (three) times daily.  Dispense: 90 tablet; Refill: 2  6. Vaginosis - metroNIDAZOLE (FLAGYL) 500 MG tablet; Take 1 tablet (500  mg total) by mouth 3 (three) times daily.  Dispense: 21 tablet; Refill: 0   Current Outpatient Prescriptions:  .  ALPRAZolam (XANAX) 1 MG tablet, Take 1 tablet (1 mg total) by mouth 3 (three) times daily., Disp: 90 tablet, Rfl: 2 .  atorvastatin (LIPITOR) 10 MG tablet, TAKE 1 TABLET BY MOUTH ONCE A DAY, Disp: 30 tablet, Rfl: 11 .  cetirizine (ZYRTEC) 10 MG tablet, TAKE 1 TABLET BY MOUTH AS NEEDED ONCE A DAY, Disp: 30 tablet, Rfl: 5 .  fenofibrate micronized (LOFIBRA) 134 MG capsule, Take 1 capsule (134 mg total) by mouth daily before breakfast., Disp: 30 capsule, Rfl: 11 .  gabapentin (NEURONTIN) 800 MG tablet, Take 1 tablet (800 mg total) by mouth 4 (four) times daily., Disp: 120 tablet, Rfl: 11 .  GLOBAL EASE INJECT PEN NEEDLES 32G X 4 MM MISC, USE WITH INSULIN PENS UP TO 5 TIMES DAILY (BILL INSULIN FIRST FOR A $0 COPAY), Disp: , Rfl: 12 .  hydrochlorothiazide (HYDRODIURIL) 25 MG tablet, TAKE 1 TABLET BY MOUTH DAILY, Disp: 30 tablet, Rfl: 3 .  LANTUS SOLOSTAR 100 UNIT/ML Solostar Pen, Inject 30 Units into the skin at bedtime., Disp: 15 mL, Rfl: 12 .  liraglutide (VICTOZA) 18 MG/3ML SOPN, Inject 0.3 mLs (1.8 mg total) into the skin daily., Disp: 6 mL, Rfl: 11 .  meloxicam (MOBIC) 15 MG tablet, Take 1 tablet (15 mg total) by mouth daily., Disp: 90 tablet, Rfl: 1 .  NOVOLOG FLEXPEN 100 UNIT/ML FlexPen, INJECT 10-20 UNITS UNDER SKIN BEFORE MEALS, Disp: 15 mL, Rfl: 11 .  oxycodone (ROXICODONE) 30 MG immediate release tablet, Take 1 tablet (30 mg total) by mouth every 4 (four) hours as needed for pain., Disp: 120 tablet, Rfl: 0 .  oxycodone (ROXICODONE) 30 MG immediate release tablet, Take 1 tablet (30 mg total) by mouth every 6 (six) hours., Disp: 120 tablet, Rfl: 0 .  oxycodone (ROXICODONE) 30 MG immediate release tablet, Take 1 tablet (30 mg total) by mouth every 6 (six) hours as needed for pain., Disp: 120  tablet, Rfl: 0 .  pantoprazole (PROTONIX) 40 MG tablet, TAKE 1 TABLET BY MOUTH EVERY DAY,  Disp: 30 tablet, Rfl: 11 .  tizanidine (ZANAFLEX) 6 MG capsule, Take 1 capsule (6 mg total) by mouth 3 (three) times daily., Disp: 90 capsule, Rfl: 5 .  fluconazole (DIFLUCAN) 150 MG tablet, TAKE 1 TABLET BY MOUTH ONCE A WEEK, Disp: 4 tablet, Rfl: 6 .  metroNIDAZOLE (FLAGYL) 500 MG tablet, Take 1 tablet (500 mg total) by mouth 3 (three) times daily., Disp: 21 tablet, Rfl: 0  Continue all other maintenance medications as listed above.  Follow up plan: Return in about 3 months (around 06/23/2017) for recheck.  Educational handout given for survey  Remus Loffler PA-C [facility] [address]  [place], [place] [address] [phone]   03/23/2017, 4:20 PM

## 2017-03-24 ENCOUNTER — Telehealth: Payer: Self-pay

## 2017-03-24 NOTE — Telephone Encounter (Signed)
Had two forms filled out by [facility] for [employer] and they said they never rec'd the one for my back  Their fax is [phone]

## 2017-03-25 ENCOUNTER — Telehealth: Payer: Self-pay | Admitting: Physician Assistant

## 2017-03-26 ENCOUNTER — Other Ambulatory Visit: Payer: Self-pay | Admitting: Physician Assistant

## 2017-03-28 NOTE — Telephone Encounter (Signed)
Faxed both FMLA's to her today

## 2017-03-28 NOTE — Telephone Encounter (Signed)
Faxed both fmla's to patient

## 2017-04-06 ENCOUNTER — Other Ambulatory Visit: Payer: Self-pay | Admitting: Physician Assistant

## 2017-04-06 DIAGNOSIS — N760 Acute vaginitis: Secondary | ICD-10-CM

## 2017-04-15 ENCOUNTER — Other Ambulatory Visit: Payer: Self-pay | Admitting: Physician Assistant

## 2017-04-18 ENCOUNTER — Telehealth: Payer: Self-pay | Admitting: Physician Assistant

## 2017-04-18 MED ORDER — CLINDAMYCIN HCL 300 MG PO CAPS: 300.0000 mg | ORAL_CAPSULE | Freq: Three times a day (TID) | ORAL | 0 refills | Status: DC

## 2017-04-18 NOTE — Telephone Encounter (Signed)
Pt aware.

## 2017-04-20 ENCOUNTER — Ambulatory Visit: Payer: Medicare Other | Admitting: Physician Assistant

## 2017-04-29 ENCOUNTER — Ambulatory Visit: Payer: Medicare Other | Admitting: Physician Assistant

## 2017-05-02 ENCOUNTER — Encounter: Payer: Self-pay | Admitting: Physician Assistant

## 2017-05-06 ENCOUNTER — Ambulatory Visit: Payer: Medicare Other | Admitting: Physician Assistant

## 2017-06-14 ENCOUNTER — Telehealth: Payer: Self-pay | Admitting: Physician Assistant

## 2017-06-14 ENCOUNTER — Ambulatory Visit (INDEPENDENT_AMBULATORY_CARE_PROVIDER_SITE_OTHER): Payer: BLUE CROSS/BLUE SHIELD | Admitting: Physician Assistant

## 2017-06-14 ENCOUNTER — Encounter: Payer: Self-pay | Admitting: Physician Assistant

## 2017-06-14 VITALS — BP 153/92 | HR 114 | Temp 98.4°F | Ht 64.0 in | Wt 164.0 lb

## 2017-06-14 DIAGNOSIS — I10 Essential (primary) hypertension: Secondary | ICD-10-CM | POA: Diagnosis not present

## 2017-06-14 DIAGNOSIS — M2550 Pain in unspecified joint: Secondary | ICD-10-CM | POA: Diagnosis not present

## 2017-06-14 DIAGNOSIS — M545 Low back pain, unspecified: Secondary | ICD-10-CM

## 2017-06-14 DIAGNOSIS — M5137 Other intervertebral disc degeneration, lumbosacral region: Secondary | ICD-10-CM | POA: Diagnosis not present

## 2017-06-14 DIAGNOSIS — M79605 Pain in left leg: Secondary | ICD-10-CM

## 2017-06-14 DIAGNOSIS — M5090 Cervical disc disorder, unspecified, unspecified cervical region: Secondary | ICD-10-CM | POA: Diagnosis not present

## 2017-06-14 DIAGNOSIS — E1039 Type 1 diabetes mellitus with other diabetic ophthalmic complication: Secondary | ICD-10-CM | POA: Diagnosis not present

## 2017-06-14 DIAGNOSIS — M5136 Other intervertebral disc degeneration, lumbar region: Secondary | ICD-10-CM

## 2017-06-14 LAB — BAYER DCA HB A1C WAIVED: HB A1C (BAYER DCA - WAIVED): 8.2 % — ABNORMAL HIGH (ref <7.0)

## 2017-06-14 MED ORDER — MELOXICAM 15 MG PO TABS: 7.5000 mg | ORAL_TABLET | Freq: Every day | ORAL | 1 refills | Status: DC

## 2017-06-14 MED ORDER — OXYCODONE HCL 30 MG PO TABS: 30.0000 mg | ORAL_TABLET | Freq: Four times a day (QID) | ORAL | 0 refills | Status: DC | PRN

## 2017-06-14 MED ORDER — OXYCODONE HCL 30 MG PO TABS: 30.0000 mg | ORAL_TABLET | ORAL | 0 refills | Status: DC | PRN

## 2017-06-14 MED ORDER — OXYCODONE HCL 30 MG PO TABS: 30.0000 mg | ORAL_TABLET | Freq: Four times a day (QID) | ORAL | 0 refills | Status: DC

## 2017-06-14 NOTE — Telephone Encounter (Signed)
What symptoms do you have? BS and BP are high, they spike up then goes down she noticed yesterday her sugar was 300 and some and then checked it again went down to 200 something, BP last night 117/80 and this morning it was high the lowest it has been is 208/94, has checked it three times. Feels fatigue  How long have you been sick? Since this weekend  Have you been seen for this problem? No, wants appt to see Lawanna Kobus I offered her appt with another provider, pt declined   If your provider decides to give you a prescription, which pharmacy would you like for it to be sent to? [facility]   Patient informed that this information will be sent to the clinical staff for review and that they should receive a follow up call.

## 2017-06-14 NOTE — Telephone Encounter (Signed)
Per pt BP has been elevated since yesterday BS is also > 300 appt scheduled for eval

## 2017-06-14 NOTE — Patient Instructions (Signed)
In a few days you may receive a survey in the mail or online from [employer] regarding your visit with us today. Please take a moment to fill this out. Your feedback is very important to our whole office. It can help us better understand your needs as well as improve your experience and satisfaction. Thank you for taking your time to complete it. We care about you.  Rakisha Pincock, PA-C  

## 2017-06-15 ENCOUNTER — Other Ambulatory Visit: Payer: Self-pay | Admitting: Physician Assistant

## 2017-06-15 LAB — CMP14+EGFR
ALK PHOS: 58 IU/L (ref 39–117)
ALT: 20 IU/L (ref 0–32)
AST: 19 IU/L (ref 0–40)
Albumin/Globulin Ratio: 1.6 (ref 1.2–2.2)
Albumin: 4.7 g/dL (ref 3.6–4.8)
BILIRUBIN TOTAL: 0.2 mg/dL (ref 0.0–1.2)
BUN/Creatinine Ratio: 18 (ref 12–28)
BUN: 17 mg/dL (ref 8–27)
CHLORIDE: 97 mmol/L (ref 96–106)
CO2: 23 mmol/L (ref 20–29)
CREATININE: 0.92 mg/dL (ref 0.57–1.00)
Calcium: 10.3 mg/dL (ref 8.7–10.3)
GFR calc Af Amer: 75 mL/min/{1.73_m2} (ref >59)
GFR calc non Af Amer: 65 mL/min/{1.73_m2} (ref >59)
GLUCOSE: 296 mg/dL — AB (ref 65–99)
Globulin, Total: 2.9 g/dL (ref 1.5–4.5)
Potassium: 4.1 mmol/L (ref 3.5–5.2)
Sodium: 137 mmol/L (ref 134–144)
Total Protein: 7.6 g/dL (ref 6.0–8.5)

## 2017-06-15 LAB — CBC WITH DIFFERENTIAL/PLATELET
BASOS ABS: 0.1 10*3/uL (ref 0.0–0.2)
Basos: 1 %
EOS (ABSOLUTE): 0.1 10*3/uL (ref 0.0–0.4)
Eos: 2 %
Hematocrit: 43.3 % (ref 34.0–46.6)
Hemoglobin: 14.9 g/dL (ref 11.1–15.9)
IMMATURE GRANS (ABS): 0 10*3/uL (ref 0.0–0.1)
Immature Granulocytes: 0 %
LYMPHS ABS: 1.7 10*3/uL (ref 0.7–3.1)
LYMPHS: 23 %
MCH: 33.3 pg — AB (ref 26.6–33.0)
MCHC: 34.4 g/dL (ref 31.5–35.7)
MCV: 97 fL (ref 79–97)
MONOCYTES: 10 %
Monocytes Absolute: 0.8 10*3/uL (ref 0.1–0.9)
NEUTROS ABS: 4.7 10*3/uL (ref 1.4–7.0)
Neutrophils: 64 %
PLATELETS: 263 10*3/uL (ref 150–379)
RBC: 4.48 x10E6/uL (ref 3.77–5.28)
RDW: 13.2 % (ref 12.3–15.4)
WBC: 7.3 10*3/uL (ref 3.4–10.8)

## 2017-06-16 NOTE — Progress Notes (Signed)
BP (!) 153/92   Pulse (!) 114   Temp 98.4 F (36.9 C) (Oral)   Ht '5\' 4"'  (1.626 m)   Wt 164 lb (74.4 kg)   BMI 28.15 kg/m    Subjective:    Patient ID: Caitlin Pennington, female    DOB: [birth year], 67 y.o.   MRN: [id number]  HPI: Caitlin Pennington is a 66 y.o. female presenting on 06/14/2017 for No chief complaint on file.  This patient comes in for periodic recheck on medications and conditions including hypertension, diabetes type 1, cervical DDD, lumbar DDD, multiple joint pain. Reports BP readings Have been high over the past few days. She has a home monitor that was reading extremely high. She brought it in today it was really much higher than the office machine encouraged her to stop using it. She has greatly increased amount of left foot and leg pain and numbness. She has not fallen but has felt weakness in that leg. It has not gone out from rehabilitation. She will be going for an MRI of this very soon. Her diabetes has also been more elevated. She is working hard on diet measures. She is not able to exercise.   All medications are reviewed today. There are no reports of any problems with the medications. All of the medical conditions are reviewed and updated.  Lab work is reviewed and will be ordered as medically necessary. There are no new problems reported with today's visit.   Relevant past medical, surgical, family and social history reviewed and updated as indicated. Allergies and medications reviewed and updated.  Past Medical History:  Diagnosis Date  . Anxiety   . Back pain   . Diabetes mellitus without complication ([place])   . Hyperlipidemia   . Hypertension   . Neuropathy     Past Surgical History:  Procedure Laterality Date  . ABDOMINAL HYSTERECTOMY    . CHOLECYSTECTOMY    . TONSILLECTOMY AND ADENOIDECTOMY      Review of Systems  Constitutional: Negative.  Negative for activity change, fatigue and fever.  HENT: Negative.   Eyes: Negative.   Respiratory: Negative.   Negative for cough.   Cardiovascular: Negative.  Negative for chest pain.  Gastrointestinal: Negative.  Negative for abdominal pain.  Endocrine: Negative.   Genitourinary: Negative.  Negative for dysuria.  Musculoskeletal: Positive for arthralgias, back pain and myalgias.  Skin: Negative.   Neurological: Negative.     Allergies as of 06/14/2017      Reactions   Demerol [meperidine] Swelling   Stadol [butorphanol] Swelling      Medication List       Accurate as of 06/14/17 11:59 PM. Always use your most recent med list.          ALPRAZolam 1 MG tablet Commonly known as:  XANAX Take 1 tablet (1 mg total) by mouth 3 (three) times daily.   atorvastatin 10 MG tablet Commonly known as:  LIPITOR TAKE 1 TABLET BY MOUTH ONCE A DAY   cetirizine 10 MG tablet Commonly known as:  ZYRTEC TAKE 1 TABLET BY MOUTH AS NEEDED ONCE A DAY   clindamycin 300 MG capsule Commonly known as:  CLEOCIN Take 1 capsule (300 mg total) by mouth 3 (three) times daily.   fenofibrate micronized 134 MG capsule Commonly known as:  LOFIBRA Take 1 capsule (134 mg total) by mouth daily before breakfast.   fluconazole 150 MG tablet Commonly known as:  DIFLUCAN TAKE 1 TABLET BY MOUTH ONCE A WEEK  gabapentin 800 MG tablet Commonly known as:  NEURONTIN Take 1 tablet (800 mg total) by mouth 4 (four) times daily.   GLOBAL EASE INJECT PEN NEEDLES 32G X 4 MM Misc Generic drug:  Insulin Pen Needle USE WITH INSULIN PENS UP TO 5 TIMES DAILY (BILL INSULIN FIRST FOR A $0 COPAY)   hydrochlorothiazide 25 MG tablet Commonly known as:  HYDRODIURIL TAKE ONE TABLET BY MOUTH DAILY   LANTUS SOLOSTAR 100 UNIT/ML Solostar Pen Generic drug:  Insulin Glargine Inject 30 Units into the skin at bedtime.   liraglutide 18 MG/3ML Sopn Commonly known as:  VICTOZA Inject 0.3 mLs (1.8 mg total) into the skin daily.   lisinopril 20 MG tablet Commonly known as:  PRINIVIL,ZESTRIL Take 20 mg by mouth daily.   meloxicam 15 MG  tablet Commonly known as:  MOBIC Take 0.5 tablets (7.5 mg total) by mouth daily.   metroNIDAZOLE 500 MG tablet Commonly known as:  FLAGYL TAKE ONE TABLET BY MOUTH THREE TIMES DAILY   NOVOLOG FLEXPEN 100 UNIT/ML FlexPen Generic drug:  insulin aspart INJECT 10-20 UNITS UNDER SKIN BEFORE MEALS   oxycodone 30 MG immediate release tablet Commonly known as:  ROXICODONE Take 1 tablet (30 mg total) by mouth every 4 (four) hours as needed for pain.   oxycodone 30 MG immediate release tablet Commonly known as:  ROXICODONE Take 1 tablet (30 mg total) by mouth every 6 (six) hours.   oxycodone 30 MG immediate release tablet Commonly known as:  ROXICODONE Take 1 tablet (30 mg total) by mouth every 6 (six) hours as needed for pain.   pantoprazole 40 MG tablet Commonly known as:  PROTONIX TAKE 1 TABLET BY MOUTH EVERY DAY   tizanidine 6 MG capsule Commonly known as:  ZANAFLEX Take 1 capsule (6 mg total) by mouth 3 (three) times daily.          Objective:    BP (!) 153/92   Pulse (!) 114   Temp 98.4 F (36.9 C) (Oral)   Ht '5\' 4"'  (1.626 m)   Wt 164 lb (74.4 kg)   BMI 28.15 kg/m   Allergies  Allergen Reactions  . Demerol [Meperidine] Swelling  . Stadol [Butorphanol] Swelling    Physical Exam  Constitutional: She is oriented to person, place, and time. She appears well-developed and well-nourished.  HENT:  Head: Normocephalic and atraumatic.  Eyes: Pupils are equal, round, and reactive to light. Conjunctivae and EOM are normal.  Cardiovascular: Normal rate, regular rhythm, normal heart sounds and intact distal pulses.   Pulmonary/Chest: Effort normal and breath sounds normal.  Abdominal: Soft. Bowel sounds are normal.  Musculoskeletal:       Lumbar back: She exhibits decreased range of motion, tenderness, pain and spasm.  Neurological: She is alert and oriented to person, place, and time. She has normal reflexes.  Skin: Skin is warm and dry. No rash noted.  Psychiatric: She  has a normal mood and affect. Her behavior is normal. Judgment and thought content normal.    Results for orders placed or performed in visit on 06/14/17  CBC with Differential/Platelet  Result Value Ref Range   WBC 7.3 3.4 - 10.8 x10E3/uL   RBC 4.48 3.77 - 5.28 x10E6/uL   Hemoglobin 14.9 11.1 - 15.9 g/dL   Hematocrit 43.3 34.0 - 46.6 %   MCV 97 79 - 97 fL   MCH 33.3 (H) 26.6 - 33.0 pg   MCHC 34.4 31.5 - 35.7 g/dL   RDW 13.2 12.3 - 15.4 %  Platelets 263 150 - 379 x10E3/uL   Neutrophils 64 Not Estab. %   Lymphs 23 Not Estab. %   Monocytes 10 Not Estab. %   Eos 2 Not Estab. %   Basos 1 Not Estab. %   Neutrophils Absolute 4.7 1.4 - 7.0 x10E3/uL   Lymphocytes Absolute 1.7 0.7 - 3.1 x10E3/uL   Monocytes Absolute 0.8 0.1 - 0.9 x10E3/uL   EOS (ABSOLUTE) 0.1 0.0 - 0.4 x10E3/uL   Basophils Absolute 0.1 0.0 - 0.2 x10E3/uL   Immature Granulocytes 0 Not Estab. %   Immature Grans (Abs) 0.0 0.0 - 0.1 x10E3/uL  CMP14+EGFR  Result Value Ref Range   Glucose 296 (H) 65 - 99 mg/dL   BUN 17 8 - 27 mg/dL   Creatinine, Ser 0.92 0.57 - 1.00 mg/dL   GFR calc non Af Amer 65 >59 mL/min/1.73   GFR calc Af Amer 75 >59 mL/min/1.73   BUN/Creatinine Ratio 18 12 - 28   Sodium 137 134 - 144 mmol/L   Potassium 4.1 3.5 - 5.2 mmol/L   Chloride 97 96 - 106 mmol/L   CO2 23 20 - 29 mmol/L   Calcium 10.3 8.7 - 10.3 mg/dL   Total Protein 7.6 6.0 - 8.5 g/dL   Albumin 4.7 3.6 - 4.8 g/dL   Globulin, Total 2.9 1.5 - 4.5 g/dL   Albumin/Globulin Ratio 1.6 1.2 - 2.2   Bilirubin Total 0.2 0.0 - 1.2 mg/dL   Alkaline Phosphatase 58 39 - 117 IU/L   AST 19 0 - 40 IU/L   ALT 20 0 - 32 IU/L  Bayer DCA Hb A1c Waived  Result Value Ref Range   Bayer DCA Hb A1c Waived 8.2 (H) <7.0 %      Assessment & Plan:   1. Essential hypertension - lisinopril (PRINIVIL,ZESTRIL) 20 MG tablet; Take 20 mg by mouth daily.; Refill: 12 - CBC with Differential/Platelet - CMP14+EGFR - Bayer DCA Hb A1c Waived  2. 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 Cervical disc disease - meloxicam (MOBIC) 15 MG tablet; Take 0.5 tablets (7.5 mg total) by mouth daily.  Dispense: 90 tablet; Refill: 1 - oxycodone (ROXICODONE) 30 MG immediate release tablet; Take 1 tablet (30 mg total) by mouth every 6 (six) hours.  Dispense: 120 tablet; Refill: 0 - oxycodone (ROXICODONE) 30 MG immediate release tablet; Take 1 tablet (30 mg total) by mouth every 6 (six) hours as needed for pain.  Dispense: 120 tablet; Refill: 0  4. Degeneration of lumbar or lumbosacral intervertebral disc - meloxicam (MOBIC) 15 MG tablet; Take 0.5 tablets (7.5 mg total) by mouth daily.  Dispense: 90 tablet; Refill: 1 - MR Lumbar Spine Wo Contrast; Future - oxycodone (ROXICODONE) 30 MG immediate release tablet; Take 1 tablet (30 mg total) by mouth every 4 (four) hours as needed for pain.  Dispense: 120 tablet; Refill: 0 - oxycodone (ROXICODONE) 30 MG immediate release tablet; Take 1 tablet (30 mg total) by mouth every 6 (six) hours.  Dispense: 120 tablet; Refill: 0 - oxycodone (ROXICODONE) 30 MG immediate release tablet; Take 1 tablet (30 mg total) by mouth every 6 (six) hours as needed for pain.  Dispense: 120 tablet; Refill: 0  5. DDD (degenerative disc disease), lumbar - MR Lumbar Spine Wo Contrast; Future  6. Low back pain radiating to left leg - MR Lumbar Spine Wo Contrast; Future  7. Pain in joint involving multiple sites -  oxycodone (ROXICODONE) 30 MG immediate release tablet; Take 1 tablet (30 mg total) by mouth every 4 (four) hours as needed for pain.  Dispense: 120 tablet; Refill: 0    Current Outpatient Prescriptions:  .  ALPRAZolam (XANAX) 1 MG tablet, Take 1 tablet (1 mg total) by mouth 3 (three) times daily., Disp: 90 tablet, Rfl: 2 .  atorvastatin (LIPITOR) 10 MG tablet, TAKE 1 TABLET BY MOUTH ONCE A DAY, Disp: 30 tablet, Rfl: 11 .  cetirizine  (ZYRTEC) 10 MG tablet, TAKE 1 TABLET BY MOUTH AS NEEDED ONCE A DAY, Disp: 30 tablet, Rfl: 5 .  clindamycin (CLEOCIN) 300 MG capsule, Take 1 capsule (300 mg total) by mouth 3 (three) times daily., Disp: 30 capsule, Rfl: 0 .  fenofibrate micronized (LOFIBRA) 134 MG capsule, Take 1 capsule (134 mg total) by mouth daily before breakfast., Disp: 30 capsule, Rfl: 11 .  fluconazole (DIFLUCAN) 150 MG tablet, TAKE 1 TABLET BY MOUTH ONCE A WEEK, Disp: 4 tablet, Rfl: 6 .  gabapentin (NEURONTIN) 800 MG tablet, Take 1 tablet (800 mg total) by mouth 4 (four) times daily., Disp: 120 tablet, Rfl: 11 .  GLOBAL EASE INJECT PEN NEEDLES 32G X 4 MM MISC, USE WITH INSULIN PENS UP TO 5 TIMES DAILY (BILL INSULIN FIRST FOR A $0 COPAY), Disp: 100 each, Rfl: 2 .  hydrochlorothiazide (HYDRODIURIL) 25 MG tablet, TAKE ONE TABLET BY MOUTH DAILY, Disp: 30 tablet, Rfl: 5 .  LANTUS SOLOSTAR 100 UNIT/ML Solostar Pen, Inject 30 Units into the skin at bedtime., Disp: 15 mL, Rfl: 12 .  liraglutide (VICTOZA) 18 MG/3ML SOPN, Inject 0.3 mLs (1.8 mg total) into the skin daily., Disp: 6 mL, Rfl: 11 .  lisinopril (PRINIVIL,ZESTRIL) 20 MG tablet, Take 20 mg by mouth daily., Disp: , Rfl: 12 .  meloxicam (MOBIC) 15 MG tablet, Take 0.5 tablets (7.5 mg total) by mouth daily., Disp: 90 tablet, Rfl: 1 .  metroNIDAZOLE (FLAGYL) 500 MG tablet, TAKE ONE TABLET BY MOUTH THREE TIMES DAILY, Disp: 21 tablet, Rfl: 0 .  NOVOLOG FLEXPEN 100 UNIT/ML FlexPen, INJECT 10-20 UNITS UNDER SKIN BEFORE MEALS, Disp: 15 mL, Rfl: 11 .  oxycodone (ROXICODONE) 30 MG immediate release tablet, Take 1 tablet (30 mg total) by mouth every 4 (four) hours as needed for pain., Disp: 120 tablet, Rfl: 0 .  oxycodone (ROXICODONE) 30 MG immediate release tablet, Take 1 tablet (30 mg total) by mouth every 6 (six) hours., Disp: 120 tablet, Rfl: 0 .  oxycodone (ROXICODONE) 30 MG immediate release tablet, Take 1 tablet (30 mg total) by mouth every 6 (six) hours as needed for pain., Disp:  120 tablet, Rfl: 0 .  pantoprazole (PROTONIX) 40 MG tablet, TAKE 1 TABLET BY MOUTH EVERY DAY, Disp: 30 tablet, Rfl: 11 .  tizanidine (ZANAFLEX) 6 MG capsule, Take 1 capsule (6 mg total) by mouth 3 (three) times daily., Disp: 90 capsule, Rfl: 5 Continue all other maintenance medications as listed above.  Follow up plan: Return in about 4 weeks (around 07/12/2017) for RECHECK.  Educational handout given for [place] PA-C [place] [address]  [place], [place] [address] [phone]   06/16/2017, 9:35 AM

## 2017-06-20 ENCOUNTER — Telehealth: Payer: Self-pay | Admitting: Physician Assistant

## 2017-06-20 NOTE — Telephone Encounter (Signed)
Increase Lantus (long acting insulin) by one unit every day until [place] are under 150.

## 2017-06-20 NOTE — Telephone Encounter (Signed)
Patient did not go into work today due to her BS being elevated. Patient wants to know if she is ok to go back tomorrow. She also needs a note giving her permission to go back tomorrow.

## 2017-06-20 NOTE — Telephone Encounter (Signed)
It is okay to return, just continue with all the steps discussed. Okay to give note.

## 2017-06-20 NOTE — Telephone Encounter (Signed)
Patient aware and note wrote for patient.

## 2017-06-22 ENCOUNTER — Other Ambulatory Visit: Payer: Self-pay | Admitting: Physician Assistant

## 2017-06-28 ENCOUNTER — Ambulatory Visit (HOSPITAL_COMMUNITY): Payer: BLUE CROSS/BLUE SHIELD

## 2017-06-30 ENCOUNTER — Other Ambulatory Visit: Payer: Self-pay | Admitting: Physician Assistant

## 2017-07-05 ENCOUNTER — Other Ambulatory Visit: Payer: Self-pay | Admitting: Physician Assistant

## 2017-07-05 DIAGNOSIS — F411 Generalized anxiety disorder: Secondary | ICD-10-CM

## 2017-07-05 NOTE — Telephone Encounter (Signed)
Last seen 06/14/17  Lawanna KobusAngel  If approved [facility]  [phone]

## 2017-07-05 NOTE — Telephone Encounter (Signed)
Phoned in.

## 2017-07-12 ENCOUNTER — Ambulatory Visit: Payer: Medicare Other | Admitting: Physician Assistant

## 2017-07-13 ENCOUNTER — Ambulatory Visit (HOSPITAL_COMMUNITY): Payer: BLUE CROSS/BLUE SHIELD

## 2017-07-18 ENCOUNTER — Other Ambulatory Visit: Payer: Self-pay | Admitting: Physician Assistant

## 2017-07-18 DIAGNOSIS — I10 Essential (primary) hypertension: Secondary | ICD-10-CM

## 2017-07-18 NOTE — Telephone Encounter (Signed)
What is the name of the medication? Lisinopril, and lfuid pill  Have you contacted your pharmacy to request a refill? Yes, was told it would not be refilled until she get bo checked pt states her blood pressure is fine   Which pharmacy would you like this sent to? [facility]   Patient notified that their request is being sent to the clinical staff for review and that they should receive a call once it is complete. If they do not receive a call within 24 hours they can check with their pharmacy or our office.

## 2017-07-19 ENCOUNTER — Telehealth: Payer: Self-pay

## 2017-07-19 MED ORDER — PREMARIN 0.625 MG PO TABS: 0.6250 mg | ORAL_TABLET | Freq: Every day | ORAL | 1 refills | Status: DC

## 2017-07-19 NOTE — Telephone Encounter (Signed)
Medicaid preferred are capsules   Her Gabapentin written for tablets

## 2017-07-19 NOTE — Telephone Encounter (Signed)
Spoke with pharmacy and they were sending request to wrong location. Pharmacy advised ok to fill. Patient also needed refill on premarin.

## 2017-07-19 NOTE — Telephone Encounter (Signed)
This person has MEDICAID?

## 2017-07-26 ENCOUNTER — Ambulatory Visit (HOSPITAL_COMMUNITY): Payer: BLUE CROSS/BLUE SHIELD

## 2017-08-11 ENCOUNTER — Ambulatory Visit (HOSPITAL_COMMUNITY): Admission: RE | Admit: 2017-08-11 | Payer: BLUE CROSS/BLUE SHIELD | Source: Ambulatory Visit

## 2017-08-18 ENCOUNTER — Ambulatory Visit (HOSPITAL_COMMUNITY): Payer: BLUE CROSS/BLUE SHIELD | Attending: Physician Assistant

## 2017-09-05 ENCOUNTER — Ambulatory Visit (HOSPITAL_COMMUNITY)
Admission: RE | Admit: 2017-09-05 | Discharge: 2017-09-05 | Disposition: A | Discharge status: Home / Self Care | Payer: BLUE CROSS/BLUE SHIELD | Source: Ambulatory Visit | Attending: Physician Assistant | Admitting: Physician Assistant

## 2017-09-05 ENCOUNTER — Other Ambulatory Visit: Payer: Self-pay | Admitting: Physician Assistant

## 2017-09-05 DIAGNOSIS — M48061 Spinal stenosis, lumbar region without neurogenic claudication: Secondary | ICD-10-CM | POA: Diagnosis not present

## 2017-09-05 DIAGNOSIS — M545 Low back pain: Secondary | ICD-10-CM | POA: Insufficient documentation

## 2017-09-05 DIAGNOSIS — M5137 Other intervertebral disc degeneration, lumbosacral region: Secondary | ICD-10-CM | POA: Insufficient documentation

## 2017-09-05 DIAGNOSIS — M47816 Spondylosis without myelopathy or radiculopathy, lumbar region: Secondary | ICD-10-CM | POA: Insufficient documentation

## 2017-09-05 DIAGNOSIS — M5136 Other intervertebral disc degeneration, lumbar region: Secondary | ICD-10-CM | POA: Diagnosis not present

## 2017-09-05 DIAGNOSIS — M5126 Other intervertebral disc displacement, lumbar region: Secondary | ICD-10-CM | POA: Insufficient documentation

## 2017-09-05 DIAGNOSIS — E10311 Type 1 diabetes mellitus with unspecified diabetic retinopathy with macular edema: Secondary | ICD-10-CM

## 2017-09-05 DIAGNOSIS — M5090 Cervical disc disorder, unspecified, unspecified cervical region: Secondary | ICD-10-CM

## 2017-09-05 DIAGNOSIS — M79605 Pain in left leg: Secondary | ICD-10-CM | POA: Diagnosis not present

## 2017-09-07 NOTE — Telephone Encounter (Signed)
OV 09/12/17

## 2017-09-08 ENCOUNTER — Telehealth: Payer: Self-pay | Admitting: Physician Assistant

## 2017-09-08 ENCOUNTER — Other Ambulatory Visit: Payer: Self-pay | Admitting: *Deleted

## 2017-09-08 DIAGNOSIS — M5090 Cervical disc disorder, unspecified, unspecified cervical region: Secondary | ICD-10-CM

## 2017-09-08 DIAGNOSIS — M5137 Other intervertebral disc degeneration, lumbosacral region: Secondary | ICD-10-CM

## 2017-09-08 NOTE — Telephone Encounter (Signed)
Aware of results. 

## 2017-09-12 ENCOUNTER — Ambulatory Visit (INDEPENDENT_AMBULATORY_CARE_PROVIDER_SITE_OTHER): Payer: BLUE CROSS/BLUE SHIELD | Admitting: Physician Assistant

## 2017-09-12 ENCOUNTER — Other Ambulatory Visit: Payer: Self-pay | Admitting: Physician Assistant

## 2017-09-12 ENCOUNTER — Encounter: Payer: Self-pay | Admitting: Physician Assistant

## 2017-09-12 VITALS — BP 149/85 | HR 83 | Temp 97.3°F | Ht 64.0 in | Wt 158.8 lb

## 2017-09-12 DIAGNOSIS — N952 Postmenopausal atrophic vaginitis: Secondary | ICD-10-CM | POA: Diagnosis not present

## 2017-09-12 DIAGNOSIS — M2550 Pain in unspecified joint: Secondary | ICD-10-CM | POA: Diagnosis not present

## 2017-09-12 DIAGNOSIS — M5090 Cervical disc disorder, unspecified, unspecified cervical region: Secondary | ICD-10-CM | POA: Diagnosis not present

## 2017-09-12 DIAGNOSIS — F411 Generalized anxiety disorder: Secondary | ICD-10-CM

## 2017-09-12 DIAGNOSIS — E1039 Type 1 diabetes mellitus with other diabetic ophthalmic complication: Secondary | ICD-10-CM | POA: Diagnosis not present

## 2017-09-12 DIAGNOSIS — M5137 Other intervertebral disc degeneration, lumbosacral region: Secondary | ICD-10-CM | POA: Diagnosis not present

## 2017-09-12 MED ORDER — OXYCODONE HCL 30 MG PO TABS: 30.0000 mg | ORAL_TABLET | Freq: Four times a day (QID) | ORAL | 0 refills | Status: DC

## 2017-09-12 MED ORDER — GABAPENTIN 300 MG PO CAPS: 300.0000 mg | ORAL_CAPSULE | Freq: Every day | ORAL | 3 refills | Status: DC

## 2017-09-12 MED ORDER — OXYCODONE HCL 30 MG PO TABS: 30.0000 mg | ORAL_TABLET | Freq: Four times a day (QID) | ORAL | 0 refills | Status: DC | PRN

## 2017-09-12 MED ORDER — OXYCODONE HCL 30 MG PO TABS: 30.0000 mg | ORAL_TABLET | ORAL | 0 refills | Status: DC | PRN

## 2017-09-12 MED ORDER — ESTROGENS, CONJUGATED 0.625 MG/GM VA CREA: 1.0000 | TOPICAL_CREAM | Freq: Every day | VAGINAL | 12 refills | Status: DC

## 2017-09-12 MED ORDER — ALPRAZOLAM 1 MG PO TABS: 1.0000 mg | ORAL_TABLET | Freq: Three times a day (TID) | ORAL | 2 refills | Status: DC

## 2017-09-12 NOTE — Patient Instructions (Signed)
Fawn Kirkathy Holt FMLA nurse

## 2017-09-12 NOTE — Progress Notes (Signed)
fmla Back Diabetes    BP (!) 149/85   Pulse 83   Temp (!) 97.3 F (36.3 C) (Oral)   Ht 5' 4" (1.626 m)   Wt 158 lb 12.8 oz (72 kg)   BMI 27.26 kg/m     Subjective:    Patient ID: Caitlin Pennington, female    DOB: 19-Feb-1951, 67 y.o.   MRN: [id number]  HPI: Caitlin Pennington is a 67 y.o. female presenting on 09/12/2017 for MRI results  Patient's lumbar MRI is positive for multiple levels of stenosis.  She has some anterior listhesis at L4-5 at L4-5 that extends onto the left L5 nerve root.  She also has significant disc extrusion neurosurgery appointment has been made.  She has returned to work at this time.  I am sure she will be out many days and weeks over the next year with her problems with surgery.  And definitely can be performed.  She is also here for recheck on her diabetes.  All of her medications are reviewed today  Relevant past medical, surgical, family and social history reviewed and updated as indicated. Allergies and medications reviewed and updated.  Past Medical History:  Diagnosis Date  . Anxiety   . Back pain   . Diabetes mellitus without complication ([place])   . Hyperlipidemia   . Hypertension   . Neuropathy     Past Surgical History:  Procedure Laterality Date  . ABDOMINAL HYSTERECTOMY    . CHOLECYSTECTOMY    . TONSILLECTOMY AND ADENOIDECTOMY      Review of Systems  Constitutional: Negative for activity change, fatigue and fever.  HENT: Negative.   Eyes: Negative.   Respiratory: Negative.  Negative for cough.   Cardiovascular: Negative.  Negative for chest pain.  Gastrointestinal: Negative.  Negative for abdominal pain.  Endocrine: Negative.   Genitourinary: Negative.  Negative for dysuria.  Musculoskeletal: Positive for arthralgias, back pain, gait problem, joint swelling, myalgias, neck pain and neck stiffness.  Skin: Negative.   Neurological: Positive for weakness.    Allergies as of 09/12/2017      Reactions   Demerol [meperidine] Swelling   Stadol  [butorphanol] Swelling      Medication List        Accurate as of 09/12/17 10:15 PM. Always use your most recent med list.          ALPRAZolam 1 MG tablet Commonly known as:  XANAX Take 1 tablet (1 mg total) by mouth 3 (three) times daily.   atorvastatin 10 MG tablet Commonly known as:  LIPITOR TAKE 1 TABLET BY MOUTH ONCE A DAY   cetirizine 10 MG tablet Commonly known as:  ZYRTEC TAKE 1 TABLET BY MOUTH AS NEEDED ONCE A DAY   conjugated estrogens vaginal cream Commonly known as:  PREMARIN Place 1 Applicatorful vaginally daily.   fenofibrate micronized 134 MG capsule Commonly known as:  LOFIBRA TAKE 1 CAPSULE BY MOUTH EVERY MORNING   fluconazole 150 MG tablet Commonly known as:  DIFLUCAN TAKE 1 TABLET BY MOUTH ONCE A WEEK   gabapentin 300 MG capsule Commonly known as:  NEURONTIN Take 1-3 capsules (300-900 mg total) by mouth at bedtime.   GLOBAL EASE INJECT PEN NEEDLES 32G X 4 MM Misc Generic drug:  Insulin Pen Needle USE WITH INSULIN PEN UP TO FIVE TIMES DAILY (BILL INSULIN FIRST FOR A $0 COPAY)   hydrochlorothiazide 25 MG tablet Commonly known as:  HYDRODIURIL TAKE ONE TABLET BY MOUTH DAILY   LANTUS SOLOSTAR 100 UNIT/ML Solostar Pen  Generic drug:  Insulin Glargine Inject 30 Units into the skin at bedtime.   lisinopril 20 MG tablet Commonly known as:  PRINIVIL,ZESTRIL Take 20 mg by mouth daily.   meloxicam 15 MG tablet Commonly known as:  MOBIC Take 0.5 tablets (7.5 mg total) by mouth daily.   meloxicam 7.5 MG tablet Commonly known as:  MOBIC TAKE 1 TABLET BY MOUTH EVERY DAY   meloxicam 15 MG tablet Commonly known as:  MOBIC TAKE ONE TABLET BY MOUTH EVERY DAY   metaxalone 800 MG tablet Commonly known as:  SKELAXIN TAKE 1 TABLET BY MOUTH THREE TIMES DAILY   NOVOLOG FLEXPEN 100 UNIT/ML FlexPen Generic drug:  insulin aspart INJECT 10-20 UNITS UNDER SKIN BEFORE MEALS   oxycodone 30 MG immediate release tablet Commonly known as:  ROXICODONE Take 1  tablet (30 mg total) by mouth every 4 (four) hours as needed for pain.   oxycodone 30 MG immediate release tablet Commonly known as:  ROXICODONE Take 1 tablet (30 mg total) by mouth every 6 (six) hours.   oxycodone 30 MG immediate release tablet Commonly known as:  ROXICODONE Take 1 tablet (30 mg total) by mouth every 6 (six) hours as needed for pain.   pantoprazole 40 MG tablet Commonly known as:  PROTONIX TAKE 1 TABLET BY MOUTH EVERY DAY   PREMARIN 0.625 MG tablet Generic drug:  estrogens (conjugated) TAKE ONE TABLET BY MOUTH DAILY   tizanidine 6 MG capsule Commonly known as:  ZANAFLEX Take 1 capsule (6 mg total) by mouth 3 (three) times daily.   VICTOZA 18 MG/3ML Sopn Generic drug:  liraglutide INJECT 0.3 MLS (1.8 MG TOTAL) INTO THE SKIN DAILY.          Objective:    BP (!) 149/85   Pulse 83   Temp (!) 97.3 F (36.3 C) (Oral)   Ht 5' 4" (1.626 m)   Wt 158 lb 12.8 oz (72 kg)   BMI 27.26 kg/m    Allergies  Allergen Reactions  . Demerol [Meperidine] Swelling  . Stadol [Butorphanol] Swelling    Physical Exam  Constitutional: She is oriented to person, place, and time. She appears well-developed and well-nourished.  HENT:  Head: Normocephalic and atraumatic.  Eyes: Conjunctivae and EOM are normal. Pupils are equal, round, and reactive to light.  Cardiovascular: Normal rate, regular rhythm, normal heart sounds and intact distal pulses.  Pulmonary/Chest: Effort normal and breath sounds normal.  Abdominal: Soft. Bowel sounds are normal.  Musculoskeletal:       Lumbar back: She exhibits decreased range of motion, tenderness, pain and spasm.       Back:  Neurological: She is alert and oriented to person, place, and time. She has normal reflexes. She displays no atrophy and no tremor. She exhibits abnormal muscle tone.  Skin: Skin is warm and dry. No rash noted.  Psychiatric: She has a normal mood and affect. Her behavior is normal. Judgment and thought content  normal.    Results for orders placed or performed in visit on 06/14/17  CBC with Differential/Platelet  Result Value Ref Range   WBC 7.3 3.4 - 10.8 x10E3/uL   RBC 4.48 3.77 - 5.28 x10E6/uL   Hemoglobin 14.9 11.1 - 15.9 g/dL   Hematocrit 43.3 34.0 - 46.6 %   MCV 97 79 - 97 fL   MCH 33.3 (H) 26.6 - 33.0 pg   MCHC 34.4 31.5 - 35.7 g/dL   RDW 13.2 12.3 - 15.4 %   Platelets 263 150 -  379 x10E3/uL   Neutrophils 64 Not Estab. %   Lymphs 23 Not Estab. %   Monocytes 10 Not Estab. %   Eos 2 Not Estab. %   Basos 1 Not Estab. %   Neutrophils Absolute 4.7 1.4 - 7.0 x10E3/uL   Lymphocytes Absolute 1.7 0.7 - 3.1 x10E3/uL   Monocytes Absolute 0.8 0.1 - 0.9 x10E3/uL   EOS (ABSOLUTE) 0.1 0.0 - 0.4 x10E3/uL   Basophils Absolute 0.1 0.0 - 0.2 x10E3/uL   Immature Granulocytes 0 Not Estab. %   Immature Grans (Abs) 0.0 0.0 - 0.1 x10E3/uL  CMP14+EGFR  Result Value Ref Range   Glucose 296 (H) 65 - 99 mg/dL   BUN 17 8 - 27 mg/dL   Creatinine, Ser 0.92 0.57 - 1.00 mg/dL   GFR calc non Af Amer 65 >59 mL/min/1.73   GFR calc Af Amer 75 >59 mL/min/1.73   BUN/Creatinine Ratio 18 12 - 28   Sodium 137 134 - 144 mmol/L   Potassium 4.1 3.5 - 5.2 mmol/L   Chloride 97 96 - 106 mmol/L   CO2 23 20 - 29 mmol/L   Calcium 10.3 8.7 - 10.3 mg/dL   Total Protein 7.6 6.0 - 8.5 g/dL   Albumin 4.7 3.6 - 4.8 g/dL   Globulin, Total 2.9 1.5 - 4.5 g/dL   Albumin/Globulin Ratio 1.6 1.2 - 2.2   Bilirubin Total 0.2 0.0 - 1.2 mg/dL   Alkaline Phosphatase 58 39 - 117 IU/L   AST 19 0 - 40 IU/L   ALT 20 0 - 32 IU/L  Bayer DCA Hb A1c Waived  Result Value Ref Range   Bayer DCA Hb A1c Waived 8.2 (H) <7.0 %      Assessment & Plan:   1. Pain in joint involving multiple sites - oxycodone (ROXICODONE) 30 MG immediate release tablet; Take 1 tablet (30 mg total) by mouth every 4 (four) hours as needed for pain.  Dispense: 120 tablet; Refill: 0  2. Degeneration of lumbar or lumbosacral intervertebral disc DISC EXTRUSION WITH  L5 impingement FMLA intermittent and recurrent, up to 2 weeks REFERRAL to [facility] neurosurgeon - gabapentin (NEURONTIN) 300 MG capsule; Take 1-3 capsules (300-900 mg total) by mouth at bedtime.  Dispense: 90 capsule; Refill: 3 - oxycodone (ROXICODONE) 30 MG immediate release tablet; Take 1 tablet (30 mg total) by mouth every 4 (four) hours as needed for pain.  Dispense: 120 tablet; Refill: 0 - oxycodone (ROXICODONE) 30 MG immediate release tablet; Take 1 tablet (30 mg total) by mouth every 6 (six) hours.  Dispense: 120 tablet; Refill: 0 - oxycodone (ROXICODONE) 30 MG immediate release tablet; Take 1 tablet (30 mg total) by mouth every 6 (six) hours as needed for pain.  Dispense: 120 tablet; Refill: 0  3. Cervical disc disease - oxycodone (ROXICODONE) 30 MG immediate release tablet; Take 1 tablet (30 mg total) by mouth every 6 (six) hours.  Dispense: 120 tablet; Refill: 0 - oxycodone (ROXICODONE) 30 MG immediate release tablet; Take 1 tablet (30 mg total) by mouth every 6 (six) hours as needed for pain.  Dispense: 120 tablet; Refill: 0  4. Generalized anxiety disorder - ALPRAZolam (XANAX) 1 MG tablet; Take 1 tablet (1 mg total) by mouth 3 (three) times daily.  Dispense: 90 tablet; Refill: 2  5. Vaginal atrophy - conjugated estrogens (PREMARIN) vaginal cream; Place 1 Applicatorful vaginally daily.  Dispense: 42.5 g; Refill: 12  6. 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  ALPRAZolam (XANAX) 1 MG tablet, Take 1 tablet (1 mg total) by mouth 3 (three) times daily., Disp: 90 tablet, Rfl: 2 .  atorvastatin (LIPITOR) 10 MG tablet, TAKE 1 TABLET BY MOUTH ONCE A DAY, Disp: 30 tablet, Rfl: 11 .  cetirizine (ZYRTEC) 10 MG tablet, TAKE 1 TABLET BY MOUTH AS NEEDED ONCE A DAY, Disp: 30 tablet, Rfl: 5 .  conjugated estrogens (PREMARIN) vaginal cream, Place 1 Applicatorful vaginally daily., Disp: 42.5 g, Rfl: 12 .  fenofibrate micronized  (LOFIBRA) 134 MG capsule, TAKE 1 CAPSULE BY MOUTH EVERY MORNING, Disp: 30 capsule, Rfl: 3 .  fluconazole (DIFLUCAN) 150 MG tablet, TAKE 1 TABLET BY MOUTH ONCE A WEEK, Disp: 4 tablet, Rfl: 6 .  gabapentin (NEURONTIN) 300 MG capsule, Take 1-3 capsules (300-900 mg total) by mouth at bedtime., Disp: 90 capsule, Rfl: 3 .  GLOBAL EASE INJECT PEN NEEDLES 32G X 4 MM MISC, USE WITH INSULIN PEN UP TO FIVE TIMES DAILY (BILL INSULIN FIRST FOR A $0 COPAY), Disp: 100 each, Rfl: 2 .  hydrochlorothiazide (HYDRODIURIL) 25 MG tablet, TAKE ONE TABLET BY MOUTH DAILY, Disp: 30 tablet, Rfl: 5 .  LANTUS SOLOSTAR 100 UNIT/ML Solostar Pen, Inject 30 Units into the skin at bedtime., Disp: 15 mL, Rfl: 12 .  lisinopril (PRINIVIL,ZESTRIL) 20 MG tablet, Take 20 mg by mouth daily., Disp: , Rfl: 12 .  meloxicam (MOBIC) 15 MG tablet, Take 0.5 tablets (7.5 mg total) by mouth daily., Disp: 90 tablet, Rfl: 1 .  meloxicam (MOBIC) 15 MG tablet, TAKE ONE TABLET BY MOUTH EVERY DAY, Disp: 30 tablet, Rfl: 0 .  meloxicam (MOBIC) 7.5 MG tablet, TAKE 1 TABLET BY MOUTH EVERY DAY, Disp: 90 tablet, Rfl: 1 .  metaxalone (SKELAXIN) 800 MG tablet, TAKE 1 TABLET BY MOUTH THREE TIMES DAILY, Disp: 90 tablet, Rfl: 3 .  NOVOLOG FLEXPEN 100 UNIT/ML FlexPen, INJECT 10-20 UNITS UNDER SKIN BEFORE MEALS, Disp: 15 mL, Rfl: 11 .  oxycodone (ROXICODONE) 30 MG immediate release tablet, Take 1 tablet (30 mg total) by mouth every 4 (four) hours as needed for pain., Disp: 120 tablet, Rfl: 0 .  oxycodone (ROXICODONE) 30 MG immediate release tablet, Take 1 tablet (30 mg total) by mouth every 6 (six) hours., Disp: 120 tablet, Rfl: 0 .  oxycodone (ROXICODONE) 30 MG immediate release tablet, Take 1 tablet (30 mg total) by mouth every 6 (six) hours as needed for pain., Disp: 120 tablet, Rfl: 0 .  pantoprazole (PROTONIX) 40 MG tablet, TAKE 1 TABLET BY MOUTH EVERY DAY, Disp: 30 tablet, Rfl: 11 .  PREMARIN 0.625 MG tablet, TAKE ONE TABLET BY MOUTH DAILY, Disp: 30 tablet, Rfl:  0 .  tizanidine (ZANAFLEX) 6 MG capsule, Take 1 capsule (6 mg total) by mouth 3 (three) times daily., Disp: 90 capsule, Rfl: 5 .  VICTOZA 18 MG/3ML SOPN, INJECT 0.3 MLS (1.8 MG TOTAL) INTO THE SKIN DAILY., Disp: 9 mL, Rfl: 0 Continue all other maintenance medications as listed above.  Follow up plan: Return in about 3 months (around 12/11/2017) for recheck. FMLA for DIABETES uncontrolled and DDD of lumbar spine and disc extrusion with nerve impingement recurrent and intermittent may miss up to 2 weeks DISC EXTRUSION WITH L5 impingement FMLA intermittent and recurrent, up to 2 weeks REFERRAL to [facility] neurosurgeon Educational handout given for Marion PA-C [place] [address]  [place], [place] [address] [phone]   09/12/2017, 10:15 PM

## 2017-09-27 ENCOUNTER — Other Ambulatory Visit: Payer: Self-pay | Admitting: Physician Assistant

## 2017-10-02 ENCOUNTER — Other Ambulatory Visit: Payer: Self-pay | Admitting: Physician Assistant

## 2017-10-03 NOTE — Telephone Encounter (Signed)
Last seen 09/12/17  Caitlin Pennington last lipid 05/03/16

## 2017-10-08 ENCOUNTER — Other Ambulatory Visit: Payer: Self-pay | Admitting: Physician Assistant

## 2017-10-08 DIAGNOSIS — M5137 Other intervertebral disc degeneration, lumbosacral region: Secondary | ICD-10-CM

## 2017-10-08 DIAGNOSIS — E10311 Type 1 diabetes mellitus with unspecified diabetic retinopathy with macular edema: Secondary | ICD-10-CM

## 2017-10-08 DIAGNOSIS — M5090 Cervical disc disorder, unspecified, unspecified cervical region: Secondary | ICD-10-CM

## 2017-10-21 ENCOUNTER — Other Ambulatory Visit: Payer: Medicare Other | Admitting: Physician Assistant

## 2017-10-26 ENCOUNTER — Other Ambulatory Visit: Payer: Medicare Other | Admitting: Physician Assistant

## 2017-11-02 ENCOUNTER — Encounter: Payer: Self-pay | Admitting: Physician Assistant

## 2017-11-02 ENCOUNTER — Ambulatory Visit (INDEPENDENT_AMBULATORY_CARE_PROVIDER_SITE_OTHER): Payer: BLUE CROSS/BLUE SHIELD | Admitting: Physician Assistant

## 2017-11-02 VITALS — BP 142/80 | HR 90 | Ht 64.0 in | Wt 158.4 lb

## 2017-11-02 DIAGNOSIS — Z23 Encounter for immunization: Secondary | ICD-10-CM

## 2017-11-02 DIAGNOSIS — R200 Anesthesia of skin: Secondary | ICD-10-CM

## 2017-11-02 DIAGNOSIS — R252 Cramp and spasm: Secondary | ICD-10-CM

## 2017-11-02 DIAGNOSIS — E1039 Type 1 diabetes mellitus with other diabetic ophthalmic complication: Secondary | ICD-10-CM

## 2017-11-02 DIAGNOSIS — M2550 Pain in unspecified joint: Secondary | ICD-10-CM

## 2017-11-02 DIAGNOSIS — M5090 Cervical disc disorder, unspecified, unspecified cervical region: Secondary | ICD-10-CM

## 2017-11-02 DIAGNOSIS — M5137 Other intervertebral disc degeneration, lumbosacral region: Secondary | ICD-10-CM

## 2017-11-02 DIAGNOSIS — Z0000 Encounter for general adult medical examination without abnormal findings: Secondary | ICD-10-CM | POA: Diagnosis not present

## 2017-11-02 DIAGNOSIS — E10311 Type 1 diabetes mellitus with unspecified diabetic retinopathy with macular edema: Secondary | ICD-10-CM

## 2017-11-02 LAB — BAYER DCA HB A1C WAIVED: HB A1C: 6.5 % (ref <7.0)

## 2017-11-02 MED ORDER — OXYCODONE HCL 30 MG PO TABS: 30.0000 mg | ORAL_TABLET | Freq: Four times a day (QID) | ORAL | 0 refills | Status: DC

## 2017-11-02 MED ORDER — LANTUS SOLOSTAR 100 UNIT/ML ~~LOC~~ SOPN: 60.0000 [IU] | PEN_INJECTOR | Freq: Every day | SUBCUTANEOUS | 12 refills | Status: DC

## 2017-11-02 MED ORDER — OXYCODONE HCL 30 MG PO TABS: 30.0000 mg | ORAL_TABLET | Freq: Four times a day (QID) | ORAL | 0 refills | Status: DC | PRN

## 2017-11-02 MED ORDER — FLUCONAZOLE 150 MG PO TABS: 150.0000 mg | ORAL_TABLET | Freq: Every day | ORAL | 6 refills | Status: AC

## 2017-11-02 MED ORDER — OXYCODONE HCL 30 MG PO TABS: 30.0000 mg | ORAL_TABLET | ORAL | 0 refills | Status: DC | PRN

## 2017-11-02 NOTE — Patient Instructions (Signed)
Please increase your Lantus by 1 unit each day until your fasting blood sugars are between 100-120.

## 2017-11-03 LAB — CMP14+EGFR
ALK PHOS: 58 IU/L (ref 39–117)
ALT: 17 IU/L (ref 0–32)
AST: 18 IU/L (ref 0–40)
Albumin/Globulin Ratio: 1.7 (ref 1.2–2.2)
Albumin: 4.5 g/dL (ref 3.6–4.8)
BUN/Creatinine Ratio: 21 (ref 12–28)
BUN: 14 mg/dL (ref 8–27)
Bilirubin Total: 0.3 mg/dL (ref 0.0–1.2)
CO2: 26 mmol/L (ref 20–29)
CREATININE: 0.68 mg/dL (ref 0.57–1.00)
Calcium: 9.8 mg/dL (ref 8.7–10.3)
Chloride: 100 mmol/L (ref 96–106)
GFR calc Af Amer: 105 mL/min/{1.73_m2} (ref >59)
GFR calc non Af Amer: 91 mL/min/{1.73_m2} (ref >59)
GLUCOSE: 136 mg/dL — AB (ref 65–99)
Globulin, Total: 2.7 g/dL (ref 1.5–4.5)
Potassium: 4.5 mmol/L (ref 3.5–5.2)
SODIUM: 141 mmol/L (ref 134–144)
Total Protein: 7.2 g/dL (ref 6.0–8.5)

## 2017-11-03 LAB — LIPID PANEL
CHOLESTEROL TOTAL: 166 mg/dL (ref 100–199)
Chol/HDL Ratio: 3.5 ratio (ref 0.0–4.4)
HDL: 48 mg/dL (ref >39)
LDL CALC: 91 mg/dL (ref 0–99)
TRIGLYCERIDES: 134 mg/dL (ref 0–149)
VLDL Cholesterol Cal: 27 mg/dL (ref 5–40)

## 2017-11-03 LAB — CBC WITH DIFFERENTIAL/PLATELET
BASOS ABS: 0 10*3/uL (ref 0.0–0.2)
Basos: 0 %
EOS (ABSOLUTE): 0.1 10*3/uL (ref 0.0–0.4)
Eos: 1 %
Hematocrit: 40.3 % (ref 34.0–46.6)
Hemoglobin: 14 g/dL (ref 11.1–15.9)
Immature Grans (Abs): 0 10*3/uL (ref 0.0–0.1)
Immature Granulocytes: 0 %
LYMPHS ABS: 1.8 10*3/uL (ref 0.7–3.1)
Lymphs: 23 %
MCH: 32.3 pg (ref 26.6–33.0)
MCHC: 34.7 g/dL (ref 31.5–35.7)
MCV: 93 fL (ref 79–97)
MONOS ABS: 0.5 10*3/uL (ref 0.1–0.9)
Monocytes: 7 %
Neutrophils Absolute: 5.2 10*3/uL (ref 1.4–7.0)
Neutrophils: 69 %
Platelets: 271 10*3/uL (ref 150–379)
RBC: 4.34 x10E6/uL (ref 3.77–5.28)
RDW: 12.8 % (ref 12.3–15.4)
WBC: 7.6 10*3/uL (ref 3.4–10.8)

## 2017-11-03 LAB — TSH: TSH: 2.27 u[IU]/mL (ref 0.450–4.500)

## 2017-11-03 LAB — B12 AND FOLATE PANEL
Folate: 9.8 ng/mL (ref >3.0)
Vitamin B-12: 538 pg/mL (ref 232–1245)

## 2017-11-03 NOTE — Progress Notes (Signed)
BP (!) 142/80   Pulse 90   Ht _0  (1.626 m)   Wt 158 lb 6.4 oz (71.8 kg)   BMI 27.19 kg/m    Subjective:    Patient ID: Caitlin Pennington, female    DOB: [birth year], 67 y.o.   MRN: [id number]  HPI: Caitlin Pennington is a 67 y.o. female presenting on 11/02/2017 for Annual Exam  This patient comes in for annual well physical examination. All medications are reviewed today. There are no reports of any problems with the medications. All of the medical conditions are reviewed and updated.  Lab work is reviewed and will be ordered as medically necessary.   Her diabetes has been fairly well controlled.  He will draw labs today.  She is having occasionally some cramping in her lower legs and was concerned about PAD.  No B12 has been ordered in some time.  We will have her scheduled for a mammogram very soon.  We will look at doing a colonoscopy after she has her back evaluated and possibly operated on by neurosurgery.  Past Medical History:  Diagnosis Date  . Anxiety   . Back pain   . Diabetes mellitus without complication ([place])   . Hyperlipidemia   . Hypertension   . Neuropathy    Relevant past medical, surgical, family and social history reviewed and updated as indicated. Interim medical history since our last visit reviewed. Allergies and medications reviewed and updated. DATA REVIEWED: CHART IN EPIC  Family History reviewed for pertinent findings.  Review of Systems  Constitutional: Positive for fatigue. Negative for activity change and fever.  HENT: Negative.   Eyes: Negative.   Respiratory: Negative.  Negative for cough.   Cardiovascular: Negative.  Negative for chest pain.  Gastrointestinal: Negative.  Negative for abdominal pain.  Endocrine: Negative.   Genitourinary: Negative.  Negative for dysuria.  Musculoskeletal: Positive for arthralgias, back pain, gait problem and myalgias.  Skin: Negative.   Neurological: Positive for weakness.  Psychiatric/Behavioral: The patient is  nervous/anxious.     Allergies as of 11/02/2017      Reactions   Demerol [meperidine] Swelling   Stadol [butorphanol] Swelling      Medication List        Accurate as of 11/02/17 11:59 PM. Always use your most recent med list.          ALPRAZolam 1 MG tablet Commonly known as:  XANAX Take 1 tablet (1 mg total) by mouth 3 (three) times daily.   atorvastatin 10 MG tablet Commonly known as:  LIPITOR TAKE 1 TABLET BY MOUTH ONCE A DAY   cetirizine 10 MG tablet Commonly known as:  ZYRTEC TAKE 1 TABLET BY MOUTH AS NEEDED ONCE A DAY   conjugated estrogens vaginal cream Commonly known as:  PREMARIN Place 1 Applicatorful vaginally daily.   fenofibrate micronized 134 MG capsule Commonly known as:  LOFIBRA TAKE ONE CAPSULE BY MOUTH EVERY MORNING   fluconazole 150 MG tablet Commonly known as:  DIFLUCAN Take 1 tablet (150 mg total) by mouth daily.   gabapentin 300 MG capsule Commonly known as:  NEURONTIN Take 1-3 capsules (300-900 mg total) by mouth at bedtime.   GLOBAL EASE INJECT PEN NEEDLES 32G X 4 MM Misc Generic drug:  Insulin Pen Needle USE with insulin pen UP TO FIVE times DAILY   hydrochlorothiazide 25 MG tablet Commonly known as:  HYDRODIURIL TAKE ONE TABLET BY MOUTH DAILY   LANTUS SOLOSTAR 100 UNIT/ML Solostar Pen Generic drug:  Insulin Glargine  Inject 60 Units into the skin at bedtime.   lisinopril 20 MG tablet Commonly known as:  PRINIVIL,ZESTRIL Take 20 mg by mouth daily.   meloxicam 15 MG tablet Commonly known as:  MOBIC TAKE ONE TABLET BY MOUTH EVERY DAY   metaxalone 800 MG tablet Commonly known as:  SKELAXIN TAKE ONE TABLET BY MOUTH THREE TIMES DAILY   NOVOLOG FLEXPEN 100 UNIT/ML FlexPen Generic drug:  insulin aspart INJECT 10-20 UNITS UNDER SKIN BEFORE MEALS   oxycodone 30 MG immediate release tablet Commonly known as:  ROXICODONE Take 1 tablet (30 mg total) by mouth every 6 (six) hours.   oxycodone 30 MG immediate release tablet Commonly  known as:  ROXICODONE Take 1 tablet (30 mg total) by mouth every 6 (six) hours as needed for pain.   oxycodone 30 MG immediate release tablet Commonly known as:  ROXICODONE Take 1 tablet (30 mg total) by mouth every 4 (four) hours as needed for pain.   pantoprazole 40 MG tablet Commonly known as:  PROTONIX TAKE 1 TABLET BY MOUTH EVERY DAY   PREMARIN 0.625 MG tablet Generic drug:  estrogens (conjugated) TAKE ONE TABLET BY MOUTH DAILY   tizanidine 6 MG capsule Commonly known as:  ZANAFLEX Take 1 capsule (6 mg total) by mouth 3 (three) times daily.   VICTOZA 18 MG/3ML Sopn Generic drug:  liraglutide INJECT 0.3 MLS (1.8 MG TOTAL) INTO THE SKIN DAILY.   Vitamin D (Ergocalciferol) 50000 units Caps capsule Commonly known as:  DRISDOL TAKE 1 CAPSULE BY MOUTH ONCE WEEKLY          Objective:    BP (!) 142/80   Pulse 90   Ht _0  (1.626 m)   Wt 158 lb 6.4 oz (71.8 kg)   BMI 27.19 kg/m   Allergies  Allergen Reactions  . Demerol [Meperidine] Swelling  . Stadol [Butorphanol] Swelling    Wt Readings from Last 3 Encounters:  11/02/17 158 lb 6.4 oz (71.8 kg)  09/12/17 158 lb 12.8 oz (72 kg)  06/14/17 164 lb (74.4 kg)    Physical Exam  Constitutional: She is oriented to person, place, and time. She appears well-developed and well-nourished.  HENT:  Head: Normocephalic and atraumatic.  Right Ear: Tympanic membrane, external ear and ear canal normal.  Left Ear: Tympanic membrane, external ear and ear canal normal.  Nose: Nose normal. No rhinorrhea.  Mouth/Throat: Oropharynx is clear and moist and mucous membranes are normal. No oropharyngeal exudate or posterior oropharyngeal erythema.  Eyes: Conjunctivae and EOM are normal. Pupils are equal, round, and reactive to light.  Neck: Normal range of motion. Neck supple.  Cardiovascular: Normal rate, regular rhythm, normal heart sounds and intact distal pulses.  Pulmonary/Chest: Effort normal and breath sounds normal.  Abdominal:  Soft. Bowel sounds are normal.  Neurological: She is alert and oriented to person, place, and time. She has normal reflexes.  Skin: Skin is warm and dry. No rash noted.  Psychiatric: She has a normal mood and affect. Her behavior is normal. Judgment and thought content normal.  Nursing note and vitals reviewed.   Results for orders placed or performed in visit on 11/02/17  CBC with Differential/Platelet  Result Value Ref Range   WBC 7.6 3.4 - 10.8 x10E3/uL   RBC 4.34 3.77 - 5.28 x10E6/uL   Hemoglobin 14.0 11.1 - 15.9 g/dL   Hematocrit 40.3 34.0 - 46.6 %   MCV 93 79 - 97 fL   MCH 32.3 26.6 - 33.0 pg   MCHC  34.7 31.5 - 35.7 g/dL   RDW 12.8 12.3 - 15.4 %   Platelets 271 150 - 379 x10E3/uL   Neutrophils 69 Not Estab. %   Lymphs 23 Not Estab. %   Monocytes 7 Not Estab. %   Eos 1 Not Estab. %   Basos 0 Not Estab. %   Neutrophils Absolute 5.2 1.4 - 7.0 x10E3/uL   Lymphocytes Absolute 1.8 0.7 - 3.1 x10E3/uL   Monocytes Absolute 0.5 0.1 - 0.9 x10E3/uL   EOS (ABSOLUTE) 0.1 0.0 - 0.4 x10E3/uL   Basophils Absolute 0.0 0.0 - 0.2 x10E3/uL   Immature Granulocytes 0 Not Estab. %   Immature Grans (Abs) 0.0 0.0 - 0.1 x10E3/uL  CMP14+EGFR  Result Value Ref Range   Glucose 136 (H) 65 - 99 mg/dL   BUN 14 8 - 27 mg/dL   Creatinine, Ser 0.68 0.57 - 1.00 mg/dL   GFR calc non Af Amer 91 >59 mL/min/1.73   GFR calc Af Amer 105 >59 mL/min/1.73   BUN/Creatinine Ratio 21 12 - 28   Sodium 141 134 - 144 mmol/L   Potassium 4.5 3.5 - 5.2 mmol/L   Chloride 100 96 - 106 mmol/L   CO2 26 20 - 29 mmol/L   Calcium 9.8 8.7 - 10.3 mg/dL   Total Protein 7.2 6.0 - 8.5 g/dL   Albumin 4.5 3.6 - 4.8 g/dL   Globulin, Total 2.7 1.5 - 4.5 g/dL   Albumin/Globulin Ratio 1.7 1.2 - 2.2   Bilirubin Total 0.3 0.0 - 1.2 mg/dL   Alkaline Phosphatase 58 39 - 117 IU/L   AST 18 0 - 40 IU/L   ALT 17 0 - 32 IU/L  Lipid panel  Result Value Ref Range   Cholesterol, Total 166 100 - 199 mg/dL   Triglycerides 134 0 - 149 mg/dL     HDL 48 >39 mg/dL   VLDL Cholesterol Cal 27 5 - 40 mg/dL   LDL Calculated 91 0 - 99 mg/dL   Chol/HDL Ratio 3.5 0.0 - 4.4 ratio  TSH  Result Value Ref Range   TSH 2.270 0.450 - 4.500 uIU/mL  Bayer DCA Hb A1c Waived  Result Value Ref Range   Bayer DCA Hb A1c Waived 6.5 <7.0 %  B12 and Folate Panel  Result Value Ref Range   Vitamin B-12 538 232 - 1,245 pg/mL   Folate 9.8 >3.0 ng/mL      Assessment & Plan:   1. Well adult exam - CBC with Differential/Platelet - CMP14+EGFR - Lipid panel - TSH - Bayer DCA Hb A1c Waived  2. Leg cramping - B12 and Folate Panel  3. 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  Dispense: 30 mL; Refill: 12  4. 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  Dispense: 10 tablet; Refill: 6  5. Cervical disc disease - oxycodone (ROXICODONE) 30 MG immediate release tablet; Take 1 tablet (30 mg total) by mouth every 6 (six) hours.  Dispense: 120 tablet; Refill: 0 - oxycodone (ROXICODONE) 30 MG immediate release tablet; Take 1 tablet (30 mg total) by mouth every 6 (six) hours as needed for pain.  Dispense: 120 tablet; Refill: 0  6. Degeneration of lumbar or lumbosacral intervertebral disc - oxycodone (ROXICODONE) 30  MG immediate release tablet; Take 1 tablet (30 mg total) by mouth every 6 (six) hours.  Dispense: 120 tablet; Refill: 0 - oxycodone (ROXICODONE) 30 MG immediate release tablet; Take 1 tablet (30 mg total) by mouth every 6 (six) hours as needed for pain.  Dispense: 120 tablet; Refill: 0 - oxycodone (ROXICODONE) 30 MG immediate release tablet; Take 1 tablet (30 mg total) by mouth every 4 (four) hours as needed for pain.  Dispense: 120 tablet; Refill: 0  7. Pain in  joint involving multiple sites - oxycodone (ROXICODONE) 30 MG immediate release tablet; Take 1 tablet (30 mg total) by mouth every 4 (four) hours as needed for pain.  Dispense: 120 tablet; Refill: 0  8. Numbness - B12 and Folate Panel  9. Encounter for immunization - Flu vaccine HIGH DOSE PF   Continue all other maintenance medications as listed above.  Follow up plan: Return in about 3 months (around 01/30/2018) for recheck. Mammogram schedule, record of vaccine.  Educational handout given for Ashley PA-C [facility] [address]  [place], [place] [address] [phone]   11/03/2017, 1:39 PM

## 2017-11-04 DIAGNOSIS — M4316 Spondylolisthesis, lumbar region: Secondary | ICD-10-CM | POA: Diagnosis not present

## 2017-11-04 DIAGNOSIS — M47816 Spondylosis without myelopathy or radiculopathy, lumbar region: Secondary | ICD-10-CM | POA: Diagnosis not present

## 2017-11-04 DIAGNOSIS — M5416 Radiculopathy, lumbar region: Secondary | ICD-10-CM | POA: Diagnosis not present

## 2017-11-04 DIAGNOSIS — M5126 Other intervertebral disc displacement, lumbar region: Secondary | ICD-10-CM | POA: Diagnosis not present

## 2017-11-04 DIAGNOSIS — M5136 Other intervertebral disc degeneration, lumbar region: Secondary | ICD-10-CM | POA: Diagnosis not present

## 2017-11-04 DIAGNOSIS — M549 Dorsalgia, unspecified: Secondary | ICD-10-CM | POA: Diagnosis not present

## 2017-11-04 DIAGNOSIS — M546 Pain in thoracic spine: Secondary | ICD-10-CM | POA: Diagnosis not present

## 2017-11-10 ENCOUNTER — Other Ambulatory Visit: Payer: Self-pay | Admitting: Physician Assistant

## 2017-11-24 ENCOUNTER — Other Ambulatory Visit: Payer: Self-pay | Admitting: Physician Assistant

## 2017-11-24 DIAGNOSIS — M5137 Other intervertebral disc degeneration, lumbosacral region: Secondary | ICD-10-CM

## 2017-11-24 DIAGNOSIS — M5090 Cervical disc disorder, unspecified, unspecified cervical region: Secondary | ICD-10-CM

## 2017-12-09 ENCOUNTER — Other Ambulatory Visit: Payer: Self-pay | Admitting: Physician Assistant

## 2017-12-09 DIAGNOSIS — I10 Essential (primary) hypertension: Secondary | ICD-10-CM

## 2017-12-09 DIAGNOSIS — E10311 Type 1 diabetes mellitus with unspecified diabetic retinopathy with macular edema: Secondary | ICD-10-CM

## 2017-12-26 ENCOUNTER — Other Ambulatory Visit: Payer: Self-pay | Admitting: Physician Assistant

## 2017-12-26 DIAGNOSIS — F411 Generalized anxiety disorder: Secondary | ICD-10-CM

## 2017-12-26 NOTE — Telephone Encounter (Signed)
[facility]02/17  [facility]

## 2018-01-17 ENCOUNTER — Other Ambulatory Visit: Payer: Self-pay | Admitting: Family Medicine

## 2018-01-24 ENCOUNTER — Encounter: Payer: Self-pay | Admitting: Physician Assistant

## 2018-01-24 ENCOUNTER — Ambulatory Visit (INDEPENDENT_AMBULATORY_CARE_PROVIDER_SITE_OTHER): Payer: BLUE CROSS/BLUE SHIELD | Admitting: Physician Assistant

## 2018-01-24 VITALS — BP 128/80 | HR 97 | Temp 98.2°F | Ht 64.0 in | Wt 158.6 lb

## 2018-01-24 DIAGNOSIS — M5137 Other intervertebral disc degeneration, lumbosacral region: Secondary | ICD-10-CM | POA: Diagnosis not present

## 2018-01-24 DIAGNOSIS — M79675 Pain in left toe(s): Secondary | ICD-10-CM

## 2018-01-24 DIAGNOSIS — M5090 Cervical disc disorder, unspecified, unspecified cervical region: Secondary | ICD-10-CM | POA: Diagnosis not present

## 2018-01-24 DIAGNOSIS — M2550 Pain in unspecified joint: Secondary | ICD-10-CM | POA: Diagnosis not present

## 2018-01-24 DIAGNOSIS — F411 Generalized anxiety disorder: Secondary | ICD-10-CM

## 2018-01-24 DIAGNOSIS — M79674 Pain in right toe(s): Secondary | ICD-10-CM

## 2018-01-24 MED ORDER — OXYCODONE HCL 30 MG PO TABS: 30.0000 mg | ORAL_TABLET | Freq: Four times a day (QID) | ORAL | 0 refills | Status: DC

## 2018-01-24 MED ORDER — OXYCODONE HCL 30 MG PO TABS: 30.0000 mg | ORAL_TABLET | ORAL | 0 refills | Status: DC | PRN

## 2018-01-24 MED ORDER — GABAPENTIN 300 MG PO CAPS: 600.0000 mg | ORAL_CAPSULE | Freq: Three times a day (TID) | ORAL | 5 refills | Status: DC

## 2018-01-24 MED ORDER — OXYCODONE HCL 30 MG PO TABS: 30.0000 mg | ORAL_TABLET | Freq: Four times a day (QID) | ORAL | 0 refills | Status: DC | PRN

## 2018-01-24 MED ORDER — ALPRAZOLAM 1 MG PO TABS: 1.0000 mg | ORAL_TABLET | Freq: Three times a day (TID) | ORAL | 5 refills | Status: DC

## 2018-01-25 NOTE — Progress Notes (Signed)
BP 128/80   Pulse 97   Temp 98.2 F (36.8 C) (Oral)   Ht _0  (1.626 m)   Wt 158 lb 9.6 oz (71.9 kg)   BMI 27.22 kg/m    Subjective:    Patient ID: Caitlin Pennington, female    DOB: 1950-11-28, 67 y.o.   MRN: [id number]  HPI: Caitlin Pennington is a 67 y.o. female presenting on 01/24/2018 for Toe Pain (bilateral )  This patient comes in for 42-monthrecheck on her chronic pain conditions.  The neurosurgeon did not want to perform any type of back surgery of her due to her other chronic medical problems of diabetes.  At this time she is fairly well controlled. She does need her FMLA is updated soon.  They are through [employer]  Her at the malaise are related to both her severe degenerative back problems the second 1 it due to her widely fluctuating type 1 diabetes.  She is having a new problem that has gone on for just the past couple of weeks.  She had a numbness start in both great toes at the same time.  She then began having more pain and at this point it is very sharp pain at times.  It is fairly consistent.  There is been no change in the color of her feet the temperature.  She denies any trauma in different shoes or or anything falling on her feet.  Past Medical History:  Diagnosis Date  . Anxiety   . Back pain   . Diabetes mellitus without complication ([place]   . Hyperlipidemia   . Hypertension   . Neuropathy    Relevant past medical, surgical, family and social history reviewed and updated as indicated. Interim medical history since our last visit reviewed. Allergies and medications reviewed and updated. DATA REVIEWED: CHART IN EPIC  Family History reviewed for pertinent findings.  Review of Systems  Constitutional: Negative.  Negative for activity change, fatigue and fever.  HENT: Negative.   Eyes: Negative.   Respiratory: Negative.  Negative for cough.   Cardiovascular: Negative.  Negative for chest pain.  Gastrointestinal: Negative.  Negative for abdominal pain.    Endocrine: Negative.   Genitourinary: Negative.  Negative for dysuria.  Musculoskeletal: Positive for back pain and gait problem. Negative for neck pain and neck stiffness.  Skin: Negative.   Neurological: Positive for numbness. Negative for dizziness and syncope.  Hematological: Negative.     Allergies as of 01/24/2018      Reactions   Demerol [meperidine] Swelling   Stadol [butorphanol] Swelling      Medication List        Accurate as of 01/24/18 11:59 PM. Always use your most recent med list.          ALPRAZolam 1 MG tablet Commonly known as:  XANAX Take 1 tablet (1 mg total) by mouth 3 (three) times daily.   atorvastatin 10 MG tablet Commonly known as:  LIPITOR TAKE 1 TABLET BY MOUTH ONCE A DAY   cetirizine 10 MG tablet Commonly known as:  ZYRTEC TAKE 1 TABLET BY MOUTH AS NEEDED ONCE A DAY   conjugated estrogens vaginal cream Commonly known as:  PREMARIN Place 1 Applicatorful vaginally daily.   fenofibrate micronized 134 MG capsule Commonly known as:  LOFIBRA TAKE ONE CAPSULE BY MOUTH EVERY MORNING   fluconazole 150 MG tablet Commonly known as:  DIFLUCAN Take 1 tablet (150 mg total) by mouth daily.   gabapentin 300 MG capsule  Commonly known as:  NEURONTIN Take 2 capsules (600 mg total) by mouth 3 (three) times daily.   GLOBAL EASE INJECT PEN NEEDLES 32G X 4 MM Misc Generic drug:  Insulin Pen Needle USE with insulin pen UP TO FIVE times DAILY   hydrochlorothiazide 25 MG tablet Commonly known as:  HYDRODIURIL TAKE ONE TABLET BY MOUTH DAILY   LANTUS SOLOSTAR 100 UNIT/ML Solostar Pen Generic drug:  Insulin Glargine Inject 60 Units into the skin at bedtime.   lisinopril 20 MG tablet Commonly known as:  PRINIVIL,ZESTRIL TAKE ONE TABLET BY MOUTH EVERY DAY   meloxicam 15 MG tablet Commonly known as:  MOBIC TAKE ONE TABLET BY MOUTH EVERY DAY   metaxalone 800 MG tablet Commonly known as:  SKELAXIN TAKE ONE TABLET BY MOUTH THREE TIMES DAILY   NOVOLOG  FLEXPEN 100 UNIT/ML FlexPen Generic drug:  insulin aspart INJECT 10-20 UNITS UNDER SKIN BEFORE MEALS   oxycodone 30 MG immediate release tablet Commonly known as:  ROXICODONE Take 1 tablet (30 mg total) by mouth every 6 (six) hours.   oxycodone 30 MG immediate release tablet Commonly known as:  ROXICODONE Take 1 tablet (30 mg total) by mouth every 6 (six) hours as needed for pain.   oxycodone 30 MG immediate release tablet Commonly known as:  ROXICODONE Take 1 tablet (30 mg total) by mouth every 4 (four) hours as needed for pain.   pantoprazole 40 MG tablet Commonly known as:  PROTONIX TAKE 1 TABLET BY MOUTH EVERY DAY   PREMARIN 0.625 MG tablet Generic drug:  estrogens (conjugated) TAKE ONE TABLET BY MOUTH DAILY   tizanidine 6 MG capsule Commonly known as:  ZANAFLEX Take 1 capsule (6 mg total) by mouth 3 (three) times daily.   VICTOZA 18 MG/3ML Sopn Generic drug:  liraglutide INJECT 0.3 MLS (1.8 MG TOTAL) INTO THE SKIN DAILY.   Vitamin D (Ergocalciferol) 50000 units Caps capsule Commonly known as:  DRISDOL TAKE 1 CAPSULE BY MOUTH ONCE WEEKLY          Objective:    BP 128/80   Pulse 97   Temp 98.2 F (36.8 C) (Oral)   Ht _0  (1.626 m)   Wt 158 lb 9.6 oz (71.9 kg)   BMI 27.22 kg/m   Allergies  Allergen Reactions  . Demerol [Meperidine] Swelling  . Stadol [Butorphanol] Swelling    Wt Readings from Last 3 Encounters:  01/24/18 158 lb 9.6 oz (71.9 kg)  11/02/17 158 lb 6.4 oz (71.8 kg)  09/12/17 158 lb 12.8 oz (72 kg)    Physical Exam  Constitutional: She is oriented to person, place, and time. She appears well-developed and well-nourished.  HENT:  Head: Normocephalic and atraumatic.  Right Ear: Tympanic membrane, external ear and ear canal normal.  Left Ear: Tympanic membrane, external ear and ear canal normal.  Nose: Nose normal. No rhinorrhea.  Mouth/Throat: Oropharynx is clear and moist and mucous membranes are normal. No oropharyngeal exudate or  posterior oropharyngeal erythema.  Eyes: Pupils are equal, round, and reactive to light. Conjunctivae and EOM are normal.  Neck: Normal range of motion. Neck supple.  Cardiovascular: Normal rate, regular rhythm, normal heart sounds and intact distal pulses.  Pulmonary/Chest: Effort normal and breath sounds normal.  Abdominal: Soft. Bowel sounds are normal.  Neurological: She is alert and oriented to person, place, and time. She has normal reflexes. She displays normal reflexes. A sensory deficit is present. She exhibits normal muscle tone.  Skin: Skin is warm and dry. No  rash noted.  Psychiatric: She has a normal mood and affect. Her behavior is normal. Judgment and thought content normal.   Diabetic Foot Exam - Simple   Simple Foot Form Diabetic Foot exam was performed with the following findings:  Yes 01/25/2018  9:36 AM  Visual Inspection No deformities, no ulcerations, no other skin breakdown bilaterally:  Yes Sensation Testing See comments:  Yes Pulse Check Posterior Tibialis and Dorsalis pulse intact bilaterally:  Yes Comments No sensation on great toes to monofilament.  All other points have sensation.     Results for orders placed or performed in visit on 11/02/17  CBC with Differential/Platelet  Result Value Ref Range   WBC 7.6 3.4 - 10.8 x10E3/uL   RBC 4.34 3.77 - 5.28 x10E6/uL   Hemoglobin 14.0 11.1 - 15.9 g/dL   Hematocrit 40.3 34.0 - 46.6 %   MCV 93 79 - 97 fL   MCH 32.3 26.6 - 33.0 pg   MCHC 34.7 31.5 - 35.7 g/dL   RDW 12.8 12.3 - 15.4 %   Platelets 271 150 - 379 x10E3/uL   Neutrophils 69 Not Estab. %   Lymphs 23 Not Estab. %   Monocytes 7 Not Estab. %   Eos 1 Not Estab. %   Basos 0 Not Estab. %   Neutrophils Absolute 5.2 1.4 - 7.0 x10E3/uL   Lymphocytes Absolute 1.8 0.7 - 3.1 x10E3/uL   Monocytes Absolute 0.5 0.1 - 0.9 x10E3/uL   EOS (ABSOLUTE) 0.1 0.0 - 0.4 x10E3/uL   Basophils Absolute 0.0 0.0 - 0.2 x10E3/uL   Immature Granulocytes 0 Not Estab. %    Immature Grans (Abs) 0.0 0.0 - 0.1 x10E3/uL  CMP14+EGFR  Result Value Ref Range   Glucose 136 (H) 65 - 99 mg/dL   BUN 14 8 - 27 mg/dL   Creatinine, Ser 0.68 0.57 - 1.00 mg/dL   GFR calc non Af Amer 91 >59 mL/min/1.73   GFR calc Af Amer 105 >59 mL/min/1.73   BUN/Creatinine Ratio 21 12 - 28   Sodium 141 134 - 144 mmol/L   Potassium 4.5 3.5 - 5.2 mmol/L   Chloride 100 96 - 106 mmol/L   CO2 26 20 - 29 mmol/L   Calcium 9.8 8.7 - 10.3 mg/dL   Total Protein 7.2 6.0 - 8.5 g/dL   Albumin 4.5 3.6 - 4.8 g/dL   Globulin, Total 2.7 1.5 - 4.5 g/dL   Albumin/Globulin Ratio 1.7 1.2 - 2.2   Bilirubin Total 0.3 0.0 - 1.2 mg/dL   Alkaline Phosphatase 58 39 - 117 IU/L   AST 18 0 - 40 IU/L   ALT 17 0 - 32 IU/L  Lipid panel  Result Value Ref Range   Cholesterol, Total 166 100 - 199 mg/dL   Triglycerides 134 0 - 149 mg/dL   HDL 48 >39 mg/dL   VLDL Cholesterol Cal 27 5 - 40 mg/dL   LDL Calculated 91 0 - 99 mg/dL   Chol/HDL Ratio 3.5 0.0 - 4.4 ratio  TSH  Result Value Ref Range   TSH 2.270 0.450 - 4.500 uIU/mL  Bayer DCA Hb A1c Waived  Result Value Ref Range   HB A1C (BAYER DCA - WAIVED) 6.5 <7.0 %  B12 and Folate Panel  Result Value Ref Range   Vitamin B-12 538 232 - 1,245 pg/mL   Folate 9.8 >3.0 ng/mL      Assessment & Plan:   1. Pain in toes of both feet - Ambulatory referral to Podiatry  2. Degeneration  of lumbar or lumbosacral intervertebral disc - oxycodone (ROXICODONE) 30 MG immediate release tablet; Take 1 tablet (30 mg total) by mouth every 6 (six) hours.  Dispense: 120 tablet; Refill: 0 - oxycodone (ROXICODONE) 30 MG immediate release tablet; Take 1 tablet (30 mg total) by mouth every 6 (six) hours as needed for pain.  Dispense: 120 tablet; Refill: 0 - oxycodone (ROXICODONE) 30 MG immediate release tablet; Take 1 tablet (30 mg total) by mouth every 4 (four) hours as needed for pain.  Dispense: 120 tablet; Refill: 0 - gabapentin (NEURONTIN) 300 MG capsule; Take 2 capsules (600 mg  total) by mouth 3 (three) times daily.  Dispense: 180 capsule; Refill: 5  3. Cervical disc disease - oxycodone (ROXICODONE) 30 MG immediate release tablet; Take 1 tablet (30 mg total) by mouth every 6 (six) hours.  Dispense: 120 tablet; Refill: 0 - oxycodone (ROXICODONE) 30 MG immediate release tablet; Take 1 tablet (30 mg total) by mouth every 6 (six) hours as needed for pain.  Dispense: 120 tablet; Refill: 0  4. Pain in joint involving multiple sites - oxycodone (ROXICODONE) 30 MG immediate release tablet; Take 1 tablet (30 mg total) by mouth every 4 (four) hours as needed for pain.  Dispense: 120 tablet; Refill: 0  5. Generalized anxiety disorder - ALPRAZolam (XANAX) 1 MG tablet; Take 1 tablet (1 mg total) by mouth 3 (three) times daily.  Dispense: 90 tablet; Refill: 5   Continue all other maintenance medications as listed above.  Follow up plan: Return in about 3 months (around 04/26/2018) for recheck.  Educational handout given for [place] PA-C [place] [address]  [place], [place] [address] [phone]   01/25/2018, 9:36 AM

## 2018-01-31 ENCOUNTER — Ambulatory Visit: Payer: BLUE CROSS/BLUE SHIELD | Admitting: Physician Assistant

## 2018-02-02 ENCOUNTER — Encounter: Payer: Self-pay | Admitting: *Deleted

## 2018-02-02 ENCOUNTER — Telehealth: Payer: Self-pay | Admitting: Physician Assistant

## 2018-02-02 NOTE — Telephone Encounter (Signed)
Yes, okay to do letter

## 2018-02-02 NOTE — Telephone Encounter (Signed)
She needs note to say that she was out from Tues of last week to Thurs (today) No restrictions upon coming back to work today      ATTN [employer]   fax # [phone]  Lawanna Kobus, is this letter ok to write?

## 2018-02-02 NOTE — Telephone Encounter (Signed)
Note written and faxed 

## 2018-02-07 ENCOUNTER — Telehealth: Payer: Self-pay

## 2018-02-07 ENCOUNTER — Other Ambulatory Visit: Payer: Self-pay | Admitting: Physician Assistant

## 2018-02-07 DIAGNOSIS — M5137 Other intervertebral disc degeneration, lumbosacral region: Secondary | ICD-10-CM

## 2018-02-07 MED ORDER — GABAPENTIN 600 MG PO TABS: 600.0000 mg | ORAL_TABLET | Freq: Three times a day (TID) | ORAL | 5 refills | Status: DC

## 2018-02-07 NOTE — Telephone Encounter (Signed)
[place] denied Gabapentin because the quantity is more than allowed.  Does not say how many allowed per day but says talk to your Dr about prescribing a smaller quantity or higher strength

## 2018-02-07 NOTE — Telephone Encounter (Signed)
New script fo 600 mg tab is sent

## 2018-02-22 LAB — HM DIABETES EYE EXAM

## 2018-02-24 ENCOUNTER — Other Ambulatory Visit: Payer: Self-pay | Admitting: Physician Assistant

## 2018-02-24 DIAGNOSIS — E10311 Type 1 diabetes mellitus with unspecified diabetic retinopathy with macular edema: Secondary | ICD-10-CM

## 2018-03-02 ENCOUNTER — Other Ambulatory Visit: Payer: Self-pay | Admitting: Physician Assistant

## 2018-03-15 ENCOUNTER — Ambulatory Visit (INDEPENDENT_AMBULATORY_CARE_PROVIDER_SITE_OTHER): Payer: BLUE CROSS/BLUE SHIELD

## 2018-03-15 ENCOUNTER — Encounter: Payer: Self-pay | Admitting: Physician Assistant

## 2018-03-15 ENCOUNTER — Ambulatory Visit (INDEPENDENT_AMBULATORY_CARE_PROVIDER_SITE_OTHER): Payer: BLUE CROSS/BLUE SHIELD | Admitting: Physician Assistant

## 2018-03-15 VITALS — BP 134/69 | HR 82 | Temp 97.5°F | Ht 64.0 in | Wt 160.0 lb

## 2018-03-15 DIAGNOSIS — M65241 Calcific tendinitis, right hand: Secondary | ICD-10-CM

## 2018-03-15 DIAGNOSIS — W19XXXA Unspecified fall, initial encounter: Secondary | ICD-10-CM

## 2018-03-15 DIAGNOSIS — L03116 Cellulitis of left lower limb: Secondary | ICD-10-CM | POA: Diagnosis not present

## 2018-03-15 DIAGNOSIS — G5601 Carpal tunnel syndrome, right upper limb: Secondary | ICD-10-CM

## 2018-03-15 DIAGNOSIS — S8992XA Unspecified injury of left lower leg, initial encounter: Secondary | ICD-10-CM

## 2018-03-15 DIAGNOSIS — M25562 Pain in left knee: Secondary | ICD-10-CM | POA: Diagnosis not present

## 2018-03-15 MED ORDER — CEPHALEXIN 500 MG PO CAPS: 500.0000 mg | ORAL_CAPSULE | Freq: Four times a day (QID) | ORAL | 0 refills | Status: DC

## 2018-03-17 DIAGNOSIS — M65241 Calcific tendinitis, right hand: Secondary | ICD-10-CM | POA: Insufficient documentation

## 2018-03-17 DIAGNOSIS — G5601 Carpal tunnel syndrome, right upper limb: Secondary | ICD-10-CM | POA: Insufficient documentation

## 2018-03-17 NOTE — Patient Instructions (Signed)
In a few days you may receive a survey in the mail or online from [employer] regarding your visit with us today. Please take a moment to fill this out. Your feedback is very important to our whole office. It can help us better understand your needs as well as improve your experience and satisfaction. Thank you for taking your time to complete it. We care about you.  Saanvi Hakala, PA-C  

## 2018-03-17 NOTE — Progress Notes (Signed)
BP 134/69   Pulse 82   Temp (!) 97.5 F (36.4 C) (Oral)   Ht 5\' 4"  (1.626 m)   Wt 160 lb (72.6 kg)   BMI 27.46 kg/m    Subjective:    Patient ID: Caitlin Pennington, female    DOB: 08/10/1951, 67 y.o.   MRN: [id number]  HPI: Caitlin Pennington is a 67 y.o. female presenting on 03/15/2018 for Knee Pain (left- fell at [place] ) and knot on palm of right hand  03/07/2018 this patient fell landing on concrete with her knee.  She had a good amount of pain in the knee.  And it has continued to swell.  It was skinned and now swellin g around it. She has tried to ice it and use over-the-counter medications without much benefit.  She was concerned about some longer lasting effect of her fracture.  X-ray does not show any acute fracture.  She does have an effusion which is visible on the x-ray also.  We are going to treat her conservatively and plan for orthopedic referral if needed.  The patient also has decreased strength in her right thumb with a loss of muscle in the thenar prominence she has decreased grip strength.  There are calcifications in her palm.  They do become more prominent at times.  She has not had any locking of the near fingers.  An d she would like to have a referral to the hand center I think this is appropriate.  Past Medical History:  Diagnosis Date  . Anxiety   . Back pain   . Diabetes mellitus without complication ([facility])   . Hyperlipidemia   . Hypertension   . Neuropathy    Relevant past medical, surgical, family and social history reviewed and updated as indicated. Interim medical history since our last visit reviewed. Allergies and medications reviewed and updated. DATA REVIEWED: CHART IN EPIC  Family History reviewed for pertinent findings.  Review of Systems  Constitutional: Negative.   HENT: Negative.   Eyes: Negative.   Respiratory: Negative.   Gastrointestinal: Negative.   Genitourinary: Negative.   Musculoskeletal: Positive for arthralgias, joint swelling and  myalgias.  Skin: Positive for color change and wound.    Allergies as of 03/15/2018      Reactions   Demerol [meperidine] Swelling   Stadol [butorphanol] Swelling      Medication List        Accurate as of 03/15/18 11:59 PM. Always use your most recent med list.          ALPRAZolam 1 MG tablet Commonly known as:  XANAX Take 1 tablet (1 mg total) by mouth 3 (three) times daily.   atorvastatin 10 MG tablet Commonly known as:  LIPITOR TAKE 1 TABLET BY MOUTH ONCE A DAY   cephALEXin 500 MG capsule Commonly known as:  KEFLEX Take 1 capsule (500 mg total) by mouth 4 (four) times daily.   cetirizine 10 MG tablet Commonly known as:  ZYRTEC TAKE 1 TABLET BY MOUTH AS NEEDED ONCE A DAY   conjugated estrogens vaginal cream Commonly known as:  PREMARIN Place 1 Applicatorful vaginally daily.   fenofibrate micronized 134 MG capsule Commonly known as:  LOFIBRA TAKE ONE CAPSULE BY MOUTH EVERY MORNING   fluconazole 150 MG tablet Commonly known as:  DIFLUCAN Take 1 tablet (150 mg total) by mouth daily.   gabapentin 600 MG tablet Commonly known as:  NEURONTIN Take 1 tablet (600 mg total) by mouth 3 (three) times  daily.   GLOBAL EASE INJECT PEN NEEDLES 32G X 4 MM Misc Generic drug:  Insulin Pen Needle USE with insulin pen UP TO FIVE times DAILY   hydrochlorothiazide 25 MG tablet Commonly known as:  HYDRODIURIL TAKE ONE TABLET BY MOUTH DAILY   LANTUS SOLOSTAR 100 UNIT/ML Solostar Pen Generic drug:  Insulin Glargine Inject 60 Units into the skin at bedtime.   lisinopril 20 MG tablet Commonly known as:  PRINIVIL,ZESTRIL TAKE ONE TABLET BY MOUTH EVERY DAY   meloxicam 15 MG tablet Commonly known as:  MOBIC TAKE ONE TABLET BY MOUTH EVERY DAY   metaxalone 800 MG tablet Commonly known as:  SKELAXIN TAKE ONE TABLET BY MOUTH THREE TIMES DAILY   NOVOLOG FLEXPEN 100 UNIT/ML FlexPen Generic drug:  insulin aspart INJECT 10-20 UNITS UNDER SKIN BEFORE MEALS   oxycodone 30 MG  immediate release tablet Commonly known as:  ROXICODONE Take 1 tablet (30 mg total) by mouth every 6 (six) hours.   oxycodone 30 MG immediate release tablet Commonly known as:  ROXICODONE Take 1 tablet (30 mg total) by mouth every 6 (six) hours as needed for pain.   oxycodone 30 MG immediate release tablet Commonly known as:  ROXICODONE Take 1 tablet (30 mg total) by mouth every 4 (four) hours as needed for pain.   pantoprazole 40 MG tablet Commonly known as:  PROTONIX TAKE 1 TABLET BY MOUTH EVERY DAY   PREMARIN 0.625 MG tablet Generic drug:  estrogens (conjugated) TAKE ONE TABLET BY MOUTH DAILY   tizanidine 6 MG capsule Commonly known as:  ZANAFLEX Take 1 capsule (6 mg total) by mouth 3 (three) times daily.   VICTOZA 18 MG/3ML Sopn Generic drug:  liraglutide INJECT 0.3 MLS (1.8 MG TOTAL) INTO THE SKIN DAILY.   Vitamin D (Ergocalciferol) 50000 units Caps capsule Commonly known as:  DRISDOL TAKE 1 CAPSULE BY MOUTH ONCE WEEKLY          Objective:    BP 134/69   Pulse 82   Temp (!) 97.5 F (36.4 C) (Oral)   Ht 5\' 4"  (1.626 m)   Wt 160 lb (72.6 kg)   BMI 27.46 kg/m   Allergies  Allergen Reactions  . Demerol [Meperidine] Swelling  . Stadol [Butorphanol] Swelling    Wt Readings from Last 3 Encounters:  03/15/18 160 lb (72.6 kg)  01/24/18 158 lb 9.6 oz (71.9 kg)  11/02/17 158 lb 6.4 oz (71.8 kg)    Physical Exam  Constitutional: She is oriented to person, place, and time. She appears well-developed and well-nourished.  HENT:  Head: Normocephalic and atraumatic.  Eyes: Pupils are equal, round, and reactive to light. Conjunctivae and EOM are normal.  Cardiovascular: Normal rate, regular rhythm, normal heart sounds and intact distal pulses.  Pulmonary/Chest: Effort normal and breath sounds normal.  Abdominal: Soft. Bowel sounds are normal.  Neurological: She is alert and oriented to person, place, and time. She has normal reflexes.  Skin: Skin is warm and dry.  No rash noted.  Psychiatric: She has a normal mood and affect. Her behavior is normal. Judgment and thought content normal.    Results for orders placed or performed in visit on 02/27/18  HM DIABETES EYE EXAM  Result Value Ref Range   HM Diabetic Eye Exam No Retinopathy No Retinopathy      Assessment & Plan:   1. Fall, initial encounter - DG Knee 1-2 Views Left; Future  2. Cellulitis of knee, left - cephALEXin (KEFLEX) 500 MG capsule; Take 1  capsule (500 mg total) by mouth 4 (four) times daily.  Dispense: 40 capsule; Refill: 0  3. Injury of knee, left, initial encounter Rest and treat  4. Thenar atrophy, right Hand surgery  5. Calcific tendinitis of hand, right Hand surgery  Continue all other maintenance medications as listed above.  Follow up plan: No follow-ups on file.  Educational handout given for survey  Remus Loffler PA-C [facility] [address]  [place], [place] [address] [phone]   03/17/2018, 10:57 AM

## 2018-03-22 ENCOUNTER — Other Ambulatory Visit: Payer: Self-pay | Admitting: Family Medicine

## 2018-04-14 ENCOUNTER — Other Ambulatory Visit: Payer: Self-pay | Admitting: Physician Assistant

## 2018-04-23 ENCOUNTER — Other Ambulatory Visit: Payer: Self-pay | Admitting: Physician Assistant

## 2018-04-23 DIAGNOSIS — I10 Essential (primary) hypertension: Secondary | ICD-10-CM

## 2018-04-26 ENCOUNTER — Ambulatory Visit (INDEPENDENT_AMBULATORY_CARE_PROVIDER_SITE_OTHER): Payer: BLUE CROSS/BLUE SHIELD

## 2018-04-26 ENCOUNTER — Ambulatory Visit (INDEPENDENT_AMBULATORY_CARE_PROVIDER_SITE_OTHER): Payer: BLUE CROSS/BLUE SHIELD | Admitting: Physician Assistant

## 2018-04-26 ENCOUNTER — Encounter: Payer: Self-pay | Admitting: Physician Assistant

## 2018-04-26 VITALS — BP 133/80 | HR 83 | Temp 98.1°F | Ht 64.0 in | Wt 156.0 lb

## 2018-04-26 DIAGNOSIS — M5137 Other intervertebral disc degeneration, lumbosacral region: Secondary | ICD-10-CM | POA: Diagnosis not present

## 2018-04-26 DIAGNOSIS — M5090 Cervical disc disorder, unspecified, unspecified cervical region: Secondary | ICD-10-CM | POA: Diagnosis not present

## 2018-04-26 DIAGNOSIS — M8588 Other specified disorders of bone density and structure, other site: Secondary | ICD-10-CM

## 2018-04-26 DIAGNOSIS — M2550 Pain in unspecified joint: Secondary | ICD-10-CM

## 2018-04-26 DIAGNOSIS — M51379 Other intervertebral disc degeneration, lumbosacral region without mention of lumbar back pain or lower extremity pain: Secondary | ICD-10-CM

## 2018-04-26 DIAGNOSIS — M24549 Contracture, unspecified hand: Secondary | ICD-10-CM | POA: Diagnosis not present

## 2018-04-26 DIAGNOSIS — G5601 Carpal tunnel syndrome, right upper limb: Secondary | ICD-10-CM | POA: Diagnosis not present

## 2018-04-26 DIAGNOSIS — Z1211 Encounter for screening for malignant neoplasm of colon: Secondary | ICD-10-CM

## 2018-04-26 DIAGNOSIS — Z1239 Encounter for other screening for malignant neoplasm of breast: Secondary | ICD-10-CM

## 2018-04-26 DIAGNOSIS — Z1231 Encounter for screening mammogram for malignant neoplasm of breast: Secondary | ICD-10-CM | POA: Diagnosis not present

## 2018-04-26 MED ORDER — OXYCODONE HCL 30 MG PO TABS: 30.0000 mg | ORAL_TABLET | Freq: Four times a day (QID) | ORAL | 0 refills | Status: DC | PRN

## 2018-04-26 MED ORDER — OXYCODONE HCL 30 MG PO TABS: 30.0000 mg | ORAL_TABLET | ORAL | 0 refills | Status: DC | PRN

## 2018-04-26 MED ORDER — CETIRIZINE HCL 10 MG PO TABS: ORAL_TABLET | ORAL | 5 refills | Status: DC

## 2018-04-26 MED ORDER — OXYCODONE HCL 30 MG PO TABS: 30.0000 mg | ORAL_TABLET | Freq: Four times a day (QID) | ORAL | 0 refills | Status: DC

## 2018-04-26 MED ORDER — ATORVASTATIN CALCIUM 10 MG PO TABS: 10.0000 mg | ORAL_TABLET | Freq: Every day | ORAL | 11 refills | Status: DC

## 2018-04-26 MED ORDER — ROPINIROLE HCL 0.5 MG PO TABS: 0.5000 mg | ORAL_TABLET | Freq: Every day | ORAL | 5 refills | Status: DC

## 2018-04-26 MED ORDER — CLOBETASOL PROPIONATE 0.05 % EX CREA: 1.0000 "application " | TOPICAL_CREAM | Freq: Two times a day (BID) | CUTANEOUS | 0 refills | Status: DC

## 2018-05-01 DIAGNOSIS — G5601 Carpal tunnel syndrome, right upper limb: Secondary | ICD-10-CM | POA: Insufficient documentation

## 2018-05-01 DIAGNOSIS — M8588 Other specified disorders of bone density and structure, other site: Secondary | ICD-10-CM | POA: Insufficient documentation

## 2018-05-01 NOTE — Progress Notes (Signed)
BP 133/80   Pulse 83   Temp 98.1 F (36.7 C) (Oral)   Ht 5\' 4"  (1.626 m)   Wt 156 lb (70.8 kg)   BMI 26.78 kg/m    Subjective:    Patient ID: Caitlin Pennington, female    DOB: 1950-12-06, 67 y.o.   MRN: [id number]  HPI: Caitlin Pennington is a 67 y.o. female presenting on 04/26/2018 for Medical Management of Chronic Issues and Hand Pain (right)  This patient comes in for a 6-month recheck on her chronic medical conditions.  They do include diabetes, severe degenerative spine in the cervical and lumbar areas.  She is continued with hand pain and the area has begun to grow contracture.  She has a hard time extending out her right fourth finger.  In the morning when she gets up her hands are quite a bit and stiff and she has to push them open.  She states the right is worse than the left.  We have discussed the possibility of carpal tunnel syndrome and would like a referral.  She does need some update on labs, screening such as mammogram, colonoscopy, DEXA.  We will get order is prepared for this.  She has no other complaints today.  Past Medical History:  Diagnosis Date  . Anxiety   . Back pain   . Diabetes mellitus without complication (HCC)   . Hyperlipidemia   . Hypertension   . Neuropathy    Relevant past medical, surgical, family and social history reviewed and updated as indicated. Interim medical history since our last visit reviewed. Allergies and medications reviewed and updated. DATA REVIEWED: CHART IN EPIC  Family History reviewed for pertinent findings.  Review of Systems  Constitutional: Negative.  Negative for activity change, fatigue and fever.  HENT: Negative.   Eyes: Negative.   Respiratory: Negative.  Negative for cough.   Cardiovascular: Negative.  Negative for chest pain.  Gastrointestinal: Negative.  Negative for abdominal pain.  Endocrine: Negative.   Genitourinary: Negative.  Negative for dysuria.  Musculoskeletal: Positive for arthralgias, back pain, gait problem,  joint swelling, myalgias and neck pain.  Skin: Negative.     Allergies as of 04/26/2018      Reactions   Demerol [meperidine] Swelling   Stadol [butorphanol] Swelling      Medication List        Accurate as of 04/26/18 11:59 PM. Always use your most recent med list.          ALPRAZolam 1 MG tablet Commonly known as:  XANAX Take 1 tablet (1 mg total) by mouth 3 (three) times daily.   atorvastatin 10 MG tablet Commonly known as:  LIPITOR Take 1 tablet (10 mg total) by mouth daily.   cetirizine 10 MG tablet Commonly known as:  ZYRTEC TAKE 1 TABLET BY MOUTH AS NEEDED ONCE A DAY   clobetasol cream 0.05 % Commonly known as:  TEMOVATE Apply 1 application topically 2 (two) times daily.   conjugated estrogens vaginal cream Commonly known as:  PREMARIN Place 1 Applicatorful vaginally daily.   fenofibrate micronized 134 MG capsule Commonly known as:  LOFIBRA TAKE ONE CAPSULE BY MOUTH EVERY MORNING   fluconazole 150 MG tablet Commonly known as:  DIFLUCAN Take 1 tablet (150 mg total) by mouth daily.   gabapentin 600 MG tablet Commonly known as:  NEURONTIN Take 1 tablet (600 mg total) by mouth 3 (three) times daily.   GLOBAL EASE INJECT PEN NEEDLES 32G X 4 MM Misc Generic drug:  Insulin Pen Needle USE with insulin pen UP TO FIVE times DAILY   hydrochlorothiazide 25 MG tablet Commonly known as:  HYDRODIURIL TAKE ONE TABLET BY MOUTH DAILY   LANTUS SOLOSTAR 100 UNIT/ML Solostar Pen Generic drug:  Insulin Glargine Inject 60 Units into the skin at bedtime.   lisinopril 20 MG tablet Commonly known as:  PRINIVIL,ZESTRIL TAKE ONE TABLET BY MOUTH EVERY DAY   meloxicam 15 MG tablet Commonly known as:  MOBIC TAKE ONE TABLET BY MOUTH EVERY DAY   metaxalone 800 MG tablet Commonly known as:  SKELAXIN TAKE ONE TABLET BY MOUTH THREE TIMES DAILY   NOVOLOG FLEXPEN 100 UNIT/ML FlexPen Generic drug:  insulin aspart INJECT 10-20 UNITS UNDER SKIN BEFORE MEALS   oxycodone 30 MG  immediate release tablet Commonly known as:  ROXICODONE Take 1 tablet (30 mg total) by mouth every 6 (six) hours.   oxycodone 30 MG immediate release tablet Commonly known as:  ROXICODONE Take 1 tablet (30 mg total) by mouth every 6 (six) hours as needed for pain.   oxycodone 30 MG immediate release tablet Commonly known as:  ROXICODONE Take 1 tablet (30 mg total) by mouth every 4 (four) hours as needed for pain.   pantoprazole 40 MG tablet Commonly known as:  PROTONIX TAKE 1 TABLET BY MOUTH EVERY DAY   PREMARIN 0.625 MG tablet Generic drug:  estrogens (conjugated) TAKE 1 TABLET BY MOUTH EVERY DAY   rOPINIRole 0.5 MG tablet Commonly known as:  REQUIP Take 1-2 tablets (0.5-1 mg total) by mouth at bedtime.   VICTOZA 18 MG/3ML Sopn Generic drug:  liraglutide INJECT 0.3 MLS (1.8 MG TOTAL) INTO THE SKIN DAILY.   Vitamin D (Ergocalciferol) 50000 units Caps capsule Commonly known as:  DRISDOL TAKE 1 CAPSULE BY MOUTH ONCE WEEKLY          Objective:    BP 133/80   Pulse 83   Temp 98.1 F (36.7 C) (Oral)   Ht 5\' 4"  (1.626 m)   Wt 156 lb (70.8 kg)   BMI 26.78 kg/m   Allergies  Allergen Reactions  . Demerol [Meperidine] Swelling  . Stadol [Butorphanol] Swelling    Wt Readings from Last 3 Encounters:  04/26/18 156 lb (70.8 kg)  03/15/18 160 lb (72.6 kg)  01/24/18 158 lb 9.6 oz (71.9 kg)    Physical Exam  Constitutional: She is oriented to person, place, and time. She appears well-developed and well-nourished.  HENT:  Head: Normocephalic and atraumatic.  Eyes: Pupils are equal, round, and reactive to light. Conjunctivae and EOM are normal.  Cardiovascular: Normal rate, regular rhythm, normal heart sounds and intact distal pulses.  Pulmonary/Chest: Effort normal and breath sounds normal.  Abdominal: Soft. Bowel sounds are normal.  Musculoskeletal:       Right hand: She exhibits decreased range of motion and tenderness. Normal sensation noted. Decreased strength  noted.       Hands: Neurological: She is alert and oriented to person, place, and time. She has normal reflexes.  Skin: Skin is warm and dry. No rash noted.  Psychiatric: She has a normal mood and affect. Her behavior is normal. Judgment and thought content normal.    Results for orders placed or performed in visit on 02/27/18  HM DIABETES EYE EXAM  Result Value Ref Range   HM Diabetic Eye Exam No Retinopathy No Retinopathy      Assessment & Plan:   1. Carpal tunnel syndrome of right wrist - Ambulatory referral to Hand Surgery  2.  Contracture of hand - Ambulatory referral to Hand Surgery  3. Cervical disc disease - oxycodone (ROXICODONE) 30 MG immediate release tablet; Take 1 tablet (30 mg total) by mouth every 6 (six) hours.  Dispense: 120 tablet; Refill: 0 - oxycodone (ROXICODONE) 30 MG immediate release tablet; Take 1 tablet (30 mg total) by mouth every 6 (six) hours as needed for pain.  Dispense: 120 tablet; Refill: 0 - oxycodone (ROXICODONE) 30 MG immediate release tablet; Take 1 tablet (30 mg total) by mouth every 4 (four) hours as needed for pain.  Dispense: 120 tablet; Refill: 0  4. Degeneration of lumbar or lumbosacral intervertebral disc - oxycodone (ROXICODONE) 30 MG immediate release tablet; Take 1 tablet (30 mg total) by mouth every 6 (six) hours.  Dispense: 120 tablet; Refill: 0 - oxycodone (ROXICODONE) 30 MG immediate release tablet; Take 1 tablet (30 mg total) by mouth every 6 (six) hours as needed for pain.  Dispense: 120 tablet; Refill: 0 - oxycodone (ROXICODONE) 30 MG immediate release tablet; Take 1 tablet (30 mg total) by mouth every 4 (four) hours as needed for pain.  Dispense: 120 tablet; Refill: 0  5. Pain in joint involving multiple sites - oxycodone (ROXICODONE) 30 MG immediate release tablet; Take 1 tablet (30 mg total) by mouth every 6 (six) hours.  Dispense: 120 tablet; Refill: 0 - oxycodone (ROXICODONE) 30 MG immediate release tablet; Take 1 tablet (30  mg total) by mouth every 6 (six) hours as needed for pain.  Dispense: 120 tablet; Refill: 0 - oxycodone (ROXICODONE) 30 MG immediate release tablet; Take 1 tablet (30 mg total) by mouth every 4 (four) hours as needed for pain.  Dispense: 120 tablet; Refill: 0  6. Screening for breast cancer - MM Digital Screening; Future  7. Screening for colon cancer - Cologuard  8. Osteopenia of lumbar spine - DG [facility] DEXA   Continue all other maintenance medications as listed above.  Follow up plan: No follow-ups on file.  Educational handout given for survey  Remus Loffler PA-C [facility] [address]  [place], [place] [address] [phone]   05/01/2018, 8:29 PM

## 2018-05-09 ENCOUNTER — Telehealth: Payer: Self-pay | Admitting: Physician Assistant

## 2018-05-09 NOTE — Telephone Encounter (Signed)
What symptoms do you have? THROWING UP AND BLOOD SUGAR IS HIGH  How long have you been sick? Since friday  Have you been seen for this problem? No, declined appt for tomorrow with another provider stated she would only see Lawanna Kobus  If your provider decides to give you a prescription, which pharmacy would you like for it to be sent to? [facility]   Patient informed that this information will be sent to the clinical staff for review and that they should receive a follow up call.

## 2018-05-09 NOTE — Telephone Encounter (Signed)
Patient states that she only wants to see Caitlin Pennington and will go to ER if she gets worse. Apt made for Friday and aware will go to ER if worse. BS 200 and just nausea.

## 2018-05-12 ENCOUNTER — Ambulatory Visit: Payer: Medicare Other | Admitting: Physician Assistant

## 2018-05-12 ENCOUNTER — Ambulatory Visit (INDEPENDENT_AMBULATORY_CARE_PROVIDER_SITE_OTHER): Payer: BLUE CROSS/BLUE SHIELD | Admitting: Physician Assistant

## 2018-05-12 ENCOUNTER — Encounter: Payer: Self-pay | Admitting: Physician Assistant

## 2018-05-12 ENCOUNTER — Ambulatory Visit: Payer: Medicare Other

## 2018-05-12 ENCOUNTER — Ambulatory Visit: Payer: Self-pay | Admitting: Physician Assistant

## 2018-05-12 VITALS — BP 152/81 | HR 89 | Temp 98.6°F | Ht 64.0 in | Wt 157.0 lb

## 2018-05-12 DIAGNOSIS — E1039 Type 1 diabetes mellitus with other diabetic ophthalmic complication: Secondary | ICD-10-CM

## 2018-05-12 NOTE — Progress Notes (Signed)
BP (!) 152/81   Pulse 89   Temp 98.6 F (37 C) (Oral)   Ht 5' 4" (1.626 m)   Wt 157 lb (71.2 kg)   BMI 26.95 kg/m    Subjective:    Patient ID: Caitlin Pennington, female    DOB: [birth year], 67 y.o.   MRN: [id number]  HPI: Caitlin Pennington is a 67 y.o. female presenting on 05/12/2018 for Diabetes (elevated BS - eyes are affected ); Fatigue; and Nausea (last was WED - with vomiting)  This patient comes in to discuss her diabetes.  She has had a very hard, in the past few weeks of her sugars being significantly high in the 304 100s.  This is quite out of the ordinary for her.  Her previous exam showed her to have fairly well-controlled insulin-dependent diabetes.  She has had excessive amount of vomiting and nausea for the last few days.  Her eyes are being affected.  And she is extremely fatigued.  We will have labs performed.  At this time we are going to have her extend her FMLA to have diabetic coverage where she might have to miss 7 to 14 days of the time due to uncontrolled disease.  Past Medical History:  Diagnosis Date  . Anxiety   . Back pain   . Diabetes mellitus without complication ([place])   . Hyperlipidemia   . Hypertension   . Neuropathy    Relevant past medical, surgical, family and social history reviewed and updated as indicated. Interim medical history since our last visit reviewed. Allergies and medications reviewed and updated. DATA REVIEWED: CHART IN EPIC  Family History reviewed for pertinent findings.  Review of Systems  Constitutional: Positive for fatigue. Negative for activity change and fever.  HENT: Negative.   Eyes: Positive for visual disturbance.  Respiratory: Negative.  Negative for cough.   Cardiovascular: Negative.  Negative for chest pain.  Gastrointestinal: Negative.  Negative for abdominal pain.  Endocrine: Positive for cold intolerance, heat intolerance, polydipsia, polyphagia and polyuria.  Genitourinary: Negative.  Negative for dysuria.    Musculoskeletal: Negative.   Skin: Negative.   Neurological: Positive for weakness.    Allergies as of 05/12/2018      Reactions   Demerol [meperidine] Swelling   Stadol [butorphanol] Swelling      Medication List        Accurate as of 05/12/18 11:59 PM. Always use your most recent med list.          ALPRAZolam 1 MG tablet Commonly known as:  XANAX Take 1 tablet (1 mg total) by mouth 3 (three) times daily.   atorvastatin 10 MG tablet Commonly known as:  LIPITOR Take 1 tablet (10 mg total) by mouth daily.   cetirizine 10 MG tablet Commonly known as:  ZYRTEC TAKE 1 TABLET BY MOUTH AS NEEDED ONCE A DAY   clobetasol cream 0.05 % Commonly known as:  TEMOVATE Apply 1 application topically 2 (two) times daily.   conjugated estrogens vaginal cream Commonly known as:  PREMARIN Place 1 Applicatorful vaginally daily.   fenofibrate micronized 134 MG capsule Commonly known as:  LOFIBRA TAKE ONE CAPSULE BY MOUTH EVERY MORNING   fluconazole 150 MG tablet Commonly known as:  DIFLUCAN Take 1 tablet (150 mg total) by mouth daily.   gabapentin 600 MG tablet Commonly known as:  NEURONTIN Take 1 tablet (600 mg total) by mouth 3 (three) times daily.   GLOBAL EASE INJECT PEN NEEDLES 32G X 4 MM Misc Generic  drug:  Insulin Pen Needle USE with insulin pen UP TO FIVE times DAILY   hydrochlorothiazide 25 MG tablet Commonly known as:  HYDRODIURIL TAKE ONE TABLET BY MOUTH DAILY   LANTUS SOLOSTAR 100 UNIT/ML Solostar Pen Generic drug:  Insulin Glargine Inject 60 Units into the skin at bedtime.   lisinopril 20 MG tablet Commonly known as:  PRINIVIL,ZESTRIL TAKE ONE TABLET BY MOUTH EVERY DAY   meloxicam 15 MG tablet Commonly known as:  MOBIC TAKE ONE TABLET BY MOUTH EVERY DAY   metaxalone 800 MG tablet Commonly known as:  SKELAXIN TAKE ONE TABLET BY MOUTH THREE TIMES DAILY   NOVOLOG FLEXPEN 100 UNIT/ML FlexPen Generic drug:  insulin aspart INJECT 10-20 UNITS UNDER SKIN  BEFORE MEALS   oxycodone 30 MG immediate release tablet Commonly known as:  ROXICODONE Take 1 tablet (30 mg total) by mouth every 6 (six) hours.   oxycodone 30 MG immediate release tablet Commonly known as:  ROXICODONE Take 1 tablet (30 mg total) by mouth every 6 (six) hours as needed for pain.   oxycodone 30 MG immediate release tablet Commonly known as:  ROXICODONE Take 1 tablet (30 mg total) by mouth every 4 (four) hours as needed for pain.   pantoprazole 40 MG tablet Commonly known as:  PROTONIX TAKE 1 TABLET BY MOUTH EVERY DAY   PREMARIN 0.625 MG tablet Generic drug:  estrogens (conjugated) TAKE 1 TABLET BY MOUTH EVERY DAY   rOPINIRole 0.5 MG tablet Commonly known as:  REQUIP Take 1-2 tablets (0.5-1 mg total) by mouth at bedtime.   VICTOZA 18 MG/3ML Sopn Generic drug:  liraglutide INJECT 0.3 MLS (1.8 MG TOTAL) INTO THE SKIN DAILY.   Vitamin D (Ergocalciferol) 50000 units Caps capsule Commonly known as:  DRISDOL TAKE 1 CAPSULE BY MOUTH ONCE WEEKLY          Objective:    BP (!) 152/81   Pulse 89   Temp 98.6 F (37 C) (Oral)   Ht 5' 4" (1.626 m)   Wt 157 lb (71.2 kg)   BMI 26.95 kg/m   Allergies  Allergen Reactions  . Demerol [Meperidine] Swelling  . Stadol [Butorphanol] Swelling    Wt Readings from Last 3 Encounters:  05/12/18 157 lb (71.2 kg)  04/26/18 156 lb (70.8 kg)  03/15/18 160 lb (72.6 kg)    Physical Exam  Constitutional: She is oriented to person, place, and time. She appears well-developed and well-nourished.  HENT:  Head: Normocephalic and atraumatic.  Eyes: Pupils are equal, round, and reactive to light. Conjunctivae and EOM are normal.  Cardiovascular: Normal rate, regular rhythm, normal heart sounds and intact distal pulses.  Pulmonary/Chest: Effort normal and breath sounds normal.  Abdominal: Soft. Bowel sounds are normal.  Neurological: She is alert and oriented to person, place, and time. She has normal reflexes.  Skin: Skin is  warm and dry. No rash noted.  Psychiatric: She has a normal mood and affect. Her behavior is normal. Judgment and thought content normal.    Results for orders placed or performed in visit on 05/12/18  CBC with Differential/Platelet  Result Value Ref Range   WBC 7.5 3.4 - 10.8 x10E3/uL   RBC 4.37 3.77 - 5.28 x10E6/uL   Hemoglobin 13.8 11.1 - 15.9 g/dL   Hematocrit 43.2 34.0 - 46.6 %   MCV 99 (H) 79 - 97 fL   MCH 31.6 26.6 - 33.0 pg   MCHC 31.9 31.5 - 35.7 g/dL   RDW 13.0 12.3 - 15.4 %  Platelets 260 150 - 450 x10E3/uL   Neutrophils 55 Not Estab. %   Lymphs 32 Not Estab. %   Monocytes 10 Not Estab. %   Eos 2 Not Estab. %   Basos 1 Not Estab. %   Neutrophils Absolute 4.2 1.4 - 7.0 x10E3/uL   Lymphocytes Absolute 2.4 0.7 - 3.1 x10E3/uL   Monocytes Absolute 0.8 0.1 - 0.9 x10E3/uL   EOS (ABSOLUTE) 0.2 0.0 - 0.4 x10E3/uL   Basophils Absolute 0.1 0.0 - 0.2 x10E3/uL   Immature Granulocytes 0 Not Estab. %   Immature Grans (Abs) 0.0 0.0 - 0.1 x10E3/uL  CMP14+EGFR  Result Value Ref Range   Glucose 219 (H) 65 - 99 mg/dL   BUN 16 8 - 27 mg/dL   Creatinine, Ser 0.74 0.57 - 1.00 mg/dL   GFR calc non Af Amer 84 >59 mL/min/1.73   GFR calc Af Amer 97 >59 mL/min/1.73   BUN/Creatinine Ratio 22 12 - 28   Sodium 142 134 - 144 mmol/L   Potassium 4.7 3.5 - 5.2 mmol/L   Chloride 101 96 - 106 mmol/L   CO2 21 20 - 29 mmol/L   Calcium 9.9 8.7 - 10.3 mg/dL   Total Protein 7.2 6.0 - 8.5 g/dL   Albumin 4.5 3.6 - 4.8 g/dL   Globulin, Total 2.7 1.5 - 4.5 g/dL   Albumin/Globulin Ratio 1.7 1.2 - 2.2   Bilirubin Total <0.2 0.0 - 1.2 mg/dL   Alkaline Phosphatase 70 39 - 117 IU/L   AST 19 0 - 40 IU/L   ALT 24 0 - 32 IU/L  TSH  Result Value Ref Range   TSH 3.830 0.450 - 4.500 uIU/mL      Assessment & Plan:   1. 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 She will be out 8/23-05/10/18 due to uncontrolled type one diabetes with hyperglycemia and ophthalmic complications.  May have intermittent leave for the next year due to this problem, monthly, 7-14 days each time  Out through 9/03-14-10 Continue all other maintenance medications as listed above.  Follow up plan: Return in about 17 days (around 05/29/2018).  Educational handout given for Francis PA-C [facility] [address]  [place], [place] [address] [phone]   05/14/2018, 4:49 PM

## 2018-05-14 LAB — CBC WITH DIFFERENTIAL/PLATELET
BASOS ABS: 0.1 10*3/uL (ref 0.0–0.2)
Basos: 1 %
EOS (ABSOLUTE): 0.2 10*3/uL (ref 0.0–0.4)
Eos: 2 %
Hematocrit: 43.2 % (ref 34.0–46.6)
Hemoglobin: 13.8 g/dL (ref 11.1–15.9)
Immature Grans (Abs): 0 10*3/uL (ref 0.0–0.1)
Immature Granulocytes: 0 %
LYMPHS: 32 %
Lymphocytes Absolute: 2.4 10*3/uL (ref 0.7–3.1)
MCH: 31.6 pg (ref 26.6–33.0)
MCHC: 31.9 g/dL (ref 31.5–35.7)
MCV: 99 fL — ABNORMAL HIGH (ref 79–97)
MONOS ABS: 0.8 10*3/uL (ref 0.1–0.9)
Monocytes: 10 %
NEUTROS ABS: 4.2 10*3/uL (ref 1.4–7.0)
Neutrophils: 55 %
PLATELETS: 260 10*3/uL (ref 150–450)
RBC: 4.37 x10E6/uL (ref 3.77–5.28)
RDW: 13 % (ref 12.3–15.4)
WBC: 7.5 10*3/uL (ref 3.4–10.8)

## 2018-05-14 LAB — CMP14+EGFR
ALK PHOS: 70 IU/L (ref 39–117)
ALT: 24 IU/L (ref 0–32)
AST: 19 IU/L (ref 0–40)
Albumin/Globulin Ratio: 1.7 (ref 1.2–2.2)
Albumin: 4.5 g/dL (ref 3.6–4.8)
BUN/Creatinine Ratio: 22 (ref 12–28)
BUN: 16 mg/dL (ref 8–27)
CHLORIDE: 101 mmol/L (ref 96–106)
CO2: 21 mmol/L (ref 20–29)
Calcium: 9.9 mg/dL (ref 8.7–10.3)
Creatinine, Ser: 0.74 mg/dL (ref 0.57–1.00)
GFR calc non Af Amer: 84 mL/min/{1.73_m2} (ref >59)
GFR, EST AFRICAN AMERICAN: 97 mL/min/{1.73_m2} (ref >59)
GLUCOSE: 219 mg/dL — AB (ref 65–99)
Globulin, Total: 2.7 g/dL (ref 1.5–4.5)
POTASSIUM: 4.7 mmol/L (ref 3.5–5.2)
Sodium: 142 mmol/L (ref 134–144)
Total Protein: 7.2 g/dL (ref 6.0–8.5)

## 2018-05-14 LAB — TSH: TSH: 3.83 u[IU]/mL (ref 0.450–4.500)

## 2018-05-15 ENCOUNTER — Other Ambulatory Visit: Payer: Self-pay | Admitting: Physician Assistant

## 2018-05-15 LAB — GLUCOSE HEMOCUE WAIVED: Glu Hemocue Waived: 206 mg/dL — ABNORMAL HIGH (ref 65–99)

## 2018-05-15 LAB — BAYER DCA HB A1C WAIVED: HB A1C: 8.8 % — AB (ref <7.0)

## 2018-05-17 ENCOUNTER — Other Ambulatory Visit: Payer: Self-pay | Admitting: Physician Assistant

## 2018-05-17 DIAGNOSIS — Z029 Encounter for administrative examinations, unspecified: Secondary | ICD-10-CM

## 2018-05-17 DIAGNOSIS — E10311 Type 1 diabetes mellitus with unspecified diabetic retinopathy with macular edema: Secondary | ICD-10-CM

## 2018-05-29 ENCOUNTER — Ambulatory Visit: Payer: Medicare Other | Admitting: Physician Assistant

## 2018-06-14 ENCOUNTER — Other Ambulatory Visit: Payer: Self-pay | Admitting: Physician Assistant

## 2018-06-30 ENCOUNTER — Telehealth: Payer: Self-pay | Admitting: Physician Assistant

## 2018-06-30 NOTE — Telephone Encounter (Signed)
Returned patient's phone call.  Patient states that she has a reaction to brand name of oxycodone that is made by [facility].  [facility] does not have generic instock and sent patient to [facility] pharmacy.  [facility] will need a new Rx and will have in stock on Monday

## 2018-07-02 ENCOUNTER — Other Ambulatory Visit: Payer: Self-pay | Admitting: Physician Assistant

## 2018-07-02 DIAGNOSIS — M5090 Cervical disc disorder, unspecified, unspecified cervical region: Secondary | ICD-10-CM

## 2018-07-02 DIAGNOSIS — M5137 Other intervertebral disc degeneration, lumbosacral region: Secondary | ICD-10-CM

## 2018-07-02 DIAGNOSIS — M2550 Pain in unspecified joint: Secondary | ICD-10-CM

## 2018-07-02 MED ORDER — OXYCODONE HCL 30 MG PO TABS: 30.0000 mg | ORAL_TABLET | Freq: Four times a day (QID) | ORAL | 0 refills | Status: DC | PRN

## 2018-07-02 NOTE — Telephone Encounter (Signed)
Sent to [facility] 

## 2018-07-03 NOTE — Telephone Encounter (Signed)
Patient aware.

## 2018-07-12 ENCOUNTER — Other Ambulatory Visit: Payer: Self-pay | Admitting: Physician Assistant

## 2018-07-16 ENCOUNTER — Other Ambulatory Visit: Payer: Self-pay | Admitting: Physician Assistant

## 2018-07-16 DIAGNOSIS — F411 Generalized anxiety disorder: Secondary | ICD-10-CM

## 2018-07-20 ENCOUNTER — Other Ambulatory Visit: Payer: Self-pay | Admitting: Physician Assistant

## 2018-07-20 ENCOUNTER — Telehealth: Payer: Self-pay | Admitting: Physician Assistant

## 2018-07-20 DIAGNOSIS — E10311 Type 1 diabetes mellitus with unspecified diabetic retinopathy with macular edema: Secondary | ICD-10-CM

## 2018-07-24 NOTE — Telephone Encounter (Signed)
[facility]/18 at [facility] [place] with this information

## 2018-07-25 ENCOUNTER — Encounter: Payer: Self-pay | Admitting: Physician Assistant

## 2018-07-25 ENCOUNTER — Ambulatory Visit (INDEPENDENT_AMBULATORY_CARE_PROVIDER_SITE_OTHER): Payer: BLUE CROSS/BLUE SHIELD | Admitting: Physician Assistant

## 2018-07-25 VITALS — BP 154/77 | HR 74 | Temp 97.5°F | Ht 64.0 in | Wt 159.2 lb

## 2018-07-25 DIAGNOSIS — M5136 Other intervertebral disc degeneration, lumbar region: Secondary | ICD-10-CM | POA: Diagnosis not present

## 2018-07-25 DIAGNOSIS — Z23 Encounter for immunization: Secondary | ICD-10-CM | POA: Diagnosis not present

## 2018-07-25 DIAGNOSIS — I10 Essential (primary) hypertension: Secondary | ICD-10-CM | POA: Diagnosis not present

## 2018-07-25 DIAGNOSIS — E1039 Type 1 diabetes mellitus with other diabetic ophthalmic complication: Secondary | ICD-10-CM | POA: Diagnosis not present

## 2018-07-25 DIAGNOSIS — N6019 Diffuse cystic mastopathy of unspecified breast: Secondary | ICD-10-CM | POA: Diagnosis not present

## 2018-07-25 DIAGNOSIS — M5090 Cervical disc disorder, unspecified, unspecified cervical region: Secondary | ICD-10-CM | POA: Diagnosis not present

## 2018-07-25 DIAGNOSIS — F411 Generalized anxiety disorder: Secondary | ICD-10-CM | POA: Diagnosis not present

## 2018-07-25 DIAGNOSIS — N952 Postmenopausal atrophic vaginitis: Secondary | ICD-10-CM

## 2018-07-25 DIAGNOSIS — E10311 Type 1 diabetes mellitus with unspecified diabetic retinopathy with macular edema: Secondary | ICD-10-CM

## 2018-07-25 DIAGNOSIS — M5137 Other intervertebral disc degeneration, lumbosacral region: Secondary | ICD-10-CM | POA: Diagnosis not present

## 2018-07-25 DIAGNOSIS — M2550 Pain in unspecified joint: Secondary | ICD-10-CM

## 2018-07-25 LAB — BAYER DCA HB A1C WAIVED: HB A1C: 8.6 % — AB (ref <7.0)

## 2018-07-25 MED ORDER — HYDROCHLOROTHIAZIDE 25 MG PO TABS: 25.0000 mg | ORAL_TABLET | Freq: Every day | ORAL | 11 refills | Status: DC

## 2018-07-25 MED ORDER — LIRAGLUTIDE 18 MG/3ML ~~LOC~~ SOPN: PEN_INJECTOR | SUBCUTANEOUS | 11 refills | Status: DC

## 2018-07-25 MED ORDER — OXYCODONE HCL 30 MG PO TABS: 30.0000 mg | ORAL_TABLET | Freq: Four times a day (QID) | ORAL | 0 refills | Status: DC

## 2018-07-25 MED ORDER — LISINOPRIL 20 MG PO TABS: 20.0000 mg | ORAL_TABLET | Freq: Every day | ORAL | 3 refills | Status: DC

## 2018-07-25 MED ORDER — INSULIN PEN NEEDLE 32G X 4 MM MISC: 11 refills | Status: DC

## 2018-07-25 MED ORDER — INSULIN ASPART 100 UNIT/ML FLEXPEN: PEN_INJECTOR | SUBCUTANEOUS | 11 refills | Status: DC

## 2018-07-25 MED ORDER — ALPRAZOLAM 1 MG PO TABS: 1.0000 mg | ORAL_TABLET | Freq: Three times a day (TID) | ORAL | 5 refills | Status: DC

## 2018-07-25 MED ORDER — CETIRIZINE HCL 10 MG PO TABS: ORAL_TABLET | ORAL | 5 refills | Status: DC

## 2018-07-25 MED ORDER — METAXALONE 800 MG PO TABS: 800.0000 mg | ORAL_TABLET | Freq: Three times a day (TID) | ORAL | 5 refills | Status: DC

## 2018-07-25 MED ORDER — MELOXICAM 7.5 MG PO TABS: 7.5000 mg | ORAL_TABLET | Freq: Every day | ORAL | 3 refills | Status: DC

## 2018-07-25 MED ORDER — LANTUS SOLOSTAR 100 UNIT/ML ~~LOC~~ SOPN: 60.0000 [IU] | PEN_INJECTOR | Freq: Every day | SUBCUTANEOUS | 12 refills | Status: DC

## 2018-07-25 MED ORDER — OXYCODONE HCL 30 MG PO TABS: 30.0000 mg | ORAL_TABLET | ORAL | 0 refills | Status: DC | PRN

## 2018-07-25 MED ORDER — PREMARIN 0.625 MG PO TABS: 0.6250 mg | ORAL_TABLET | Freq: Every day | ORAL | 11 refills | Status: DC

## 2018-07-25 MED ORDER — FENOFIBRATE MICRONIZED 134 MG PO CAPS: 134.0000 mg | ORAL_CAPSULE | Freq: Every morning | ORAL | 11 refills | Status: DC

## 2018-07-25 MED ORDER — OXYCODONE HCL 30 MG PO TABS: 30.0000 mg | ORAL_TABLET | Freq: Four times a day (QID) | ORAL | 0 refills | Status: DC | PRN

## 2018-07-25 MED ORDER — ATORVASTATIN CALCIUM 10 MG PO TABS: 10.0000 mg | ORAL_TABLET | Freq: Every day | ORAL | 11 refills | Status: DC

## 2018-07-25 MED ORDER — ESTROGENS, CONJUGATED 0.625 MG/GM VA CREA: 1.0000 | TOPICAL_CREAM | Freq: Every day | VAGINAL | 12 refills | Status: DC

## 2018-07-25 MED ORDER — VITAMIN D (ERGOCALCIFEROL) 1.25 MG (50000 UNIT) PO CAPS: 50000.0000 [IU] | ORAL_CAPSULE | ORAL | 11 refills | Status: DC

## 2018-07-25 MED ORDER — GABAPENTIN 600 MG PO TABS: 600.0000 mg | ORAL_TABLET | Freq: Three times a day (TID) | ORAL | 5 refills | Status: DC

## 2018-07-25 MED ORDER — PANTOPRAZOLE SODIUM 40 MG PO TBEC: 40.0000 mg | DELAYED_RELEASE_TABLET | Freq: Every day | ORAL | 11 refills | Status: DC

## 2018-07-25 NOTE — Progress Notes (Signed)
Acute Office Visit  Subjective:    Patient ID: Caitlin Pennington, female    DOB: [birth year], 67 y.o.   MRN: [id number]  Chief Complaint  Patient presents with  . Back Pain  . abnormal mammogram    would like to discuss    Abnormal fibrocystic breast findings on her screening mammogram. Needing diagnostics, mag views if needed and U/S if needed. Going 08/09/18 at 4 pm.  She is seen through [facility]. Never had this problem before and was concerned about the findings. Reassured this test is needed and most likely will just show glandular tissue. But will address a biopsy if need arises.  Back Pain  This is a chronic problem. The current episode started more than 1 year ago. The problem occurs constantly. The problem is unchanged. The pain is present in the sacro-iliac and lumbar spine. The quality of the pain is described as burning and shooting. The pain radiates to the right thigh and left thigh. The pain is at a severity of 8/10. The pain is severe. The pain is the same all the time. The symptoms are aggravated by bending, standing and twisting. Stiffness is present in the morning. Pertinent negatives include no weakness. She has tried analgesics, heat, bed rest, home exercises, muscle relaxant and NSAIDs for the symptoms.  Hypertension  This is a chronic problem. The current episode started more than 1 year ago. The problem is unchanged. The problem is controlled. Associated symptoms include blurred vision. Risk factors for coronary artery disease include diabetes mellitus and dyslipidemia. Past treatments include ACE inhibitors and diuretics. The current treatment provides significant improvement. There are no compliance problems.   Diabetes  She presents for her follow-up diabetic visit. She has type 1 diabetes mellitus. No MedicAlert identification noted. The initial diagnosis of diabetes was made 10 years ago. Her disease course has been improving. Hypoglycemia  symptoms include nervousness/anxiousness. Associated symptoms include blurred vision. Pertinent negatives for diabetes include no foot ulcerations, no polydipsia, no polyphagia, no polyuria, no visual change and no weakness. There are no hypoglycemic complications. Symptoms are stable. There are no diabetic complications. Risk factors for coronary artery disease include diabetes mellitus, dyslipidemia, hypertension and post-menopausal. Current diabetic treatment includes insulin injections. She is compliant with treatment all of the time. Her weight is stable. There is no change in her home blood glucose trend. Her breakfast blood glucose is taken between 6-7 am. Her breakfast blood glucose range is generally 140-180 mg/dl. Her lunch blood glucose range is generally 130-140 mg/dl. Her dinner blood glucose range is generally 130-140 mg/dl. Her bedtime blood glucose range is generally 140-180 mg/dl.     Past Medical History:  Diagnosis Date  . Anxiety   . Back pain   . Diabetes mellitus without complication ([place])   . Hyperlipidemia   . Hypertension   . Neuropathy     Past Surgical History:  Procedure Laterality Date  . ABDOMINAL HYSTERECTOMY    . CHOLECYSTECTOMY    . TONSILLECTOMY AND ADENOIDECTOMY      Family History  Problem Relation Age of Onset  . Congestive Heart Failure Mother   . Diabetes Mother   . Heart disease Father     Social History   Socioeconomic History  . Marital status: Married    Spouse name: Not on file  . Number of children: Not on file  . Years of education: Not on file  . Highest education level: Not on file  Occupational History  .  Not on file  Social Needs  . Financial resource strain: Not on file  . Food insecurity:    Worry: Not on file    Inability: Not on file  . Transportation needs:    Medical: Not on file    Non-medical: Not on file  Tobacco Use  . Smoking status: Never Smoker  . Smokeless tobacco: Never Used  Substance and Sexual  Activity  . Alcohol use: No  . Drug use: No  . Sexual activity: Not on file  Lifestyle  . Physical activity:    Days per week: Not on file    Minutes per session: Not on file  . Stress: Not on file  Relationships  . Social connections:    Talks on phone: Not on file    Gets together: Not on file    Attends religious service: Not on file    Active member of club or organization: Not on file    Attends meetings of clubs or organizations: Not on file    Relationship status: Not on file  . Intimate partner violence:    Fear of current or ex partner: Not on file    Emotionally abused: Not on file    Physically abused: Not on file    Forced sexual activity: Not on file  Other Topics Concern  . Not on file  Social History Narrative  . Not on file    Outpatient Medications Prior to Visit  Medication Sig Dispense Refill  . clobetasol cream (TEMOVATE) 7.40 % Apply 1 application topically 2 (two) times daily. 30 g 0  . fluconazole (DIFLUCAN) 150 MG tablet Take 1 tablet (150 mg total) by mouth daily. 10 tablet 6  . rOPINIRole (REQUIP) 0.5 MG tablet Take 1-2 tablets (0.5-1 mg total) by mouth at bedtime. 60 tablet 5  . ALPRAZolam (XANAX) 1 MG tablet Take 1 tablet (1 mg total) by mouth 3 (three) times daily. 90 tablet 5  . atorvastatin (LIPITOR) 10 MG tablet Take 1 tablet (10 mg total) by mouth daily. 30 tablet 11  . cetirizine (ZYRTEC) 10 MG tablet TAKE 1 TABLET BY MOUTH AS NEEDED ONCE A DAY 30 tablet 5  . conjugated estrogens (PREMARIN) vaginal cream Place 1 Applicatorful vaginally daily. 42.5 g 12  . fenofibrate micronized (LOFIBRA) 134 MG capsule TAKE ONE CAPSULE BY MOUTH EVERY MORNING 30 capsule 11  . gabapentin (NEURONTIN) 600 MG tablet Take 1 tablet (600 mg total) by mouth 3 (three) times daily. 90 tablet 5  . GLOBAL EASE INJECT PEN NEEDLES 32G X 4 MM MISC USE WITH INSULIN PEN UP TO FIVE TIMES DAILY 100 each 2  . hydrochlorothiazide (HYDRODIURIL) 25 MG tablet TAKE ONE TABLET BY MOUTH  DAILY 30 tablet 5  . LANTUS SOLOSTAR 100 UNIT/ML Solostar Pen Inject 60 Units into the skin at bedtime. 30 mL 12  . lisinopril (PRINIVIL,ZESTRIL) 20 MG tablet TAKE ONE TABLET BY MOUTH EVERY DAY 30 tablet 3  . meloxicam (MOBIC) 7.5 MG tablet TAKE 1 TABLET BY MOUTH EVERY DAY 90 tablet 0  . metaxalone (SKELAXIN) 800 MG tablet TAKE ONE TABLET BY MOUTH THREE TIMES DAILY 90 tablet 2  . NOVOLOG FLEXPEN 100 UNIT/ML FlexPen INJECT 10-20 UNITS UNDER SKIN BEFORE MEALS 15 mL 1  . oxycodone (ROXICODONE) 30 MG immediate release tablet Take 1 tablet (30 mg total) by mouth every 6 (six) hours. 120 tablet 0  . oxycodone (ROXICODONE) 30 MG immediate release tablet Take 1 tablet (30 mg total) by mouth every 4 (  four) hours as needed for pain. 120 tablet 0  . oxycodone (ROXICODONE) 30 MG immediate release tablet Take 1 tablet (30 mg total) by mouth every 6 (six) hours as needed for pain. 120 tablet 0  . pantoprazole (PROTONIX) 40 MG tablet TAKE 1 TABLET BY MOUTH EVERY DAY 30 tablet 3  . PREMARIN 0.625 MG tablet TAKE 1 TABLET BY MOUTH EVERY DAY 30 tablet 7  . VICTOZA 18 MG/3ML SOPN INJECT 0.3 MLS (1.8 MG TOTAL) INTO THE SKIN DAILY. 9 mL 0  . Vitamin D, Ergocalciferol, (DRISDOL) 50000 units CAPS capsule TAKE 1 CAPSULE BY MOUTH ONCE WEEKLY 30 capsule 11   No facility-administered medications prior to visit.     Allergies  Allergen Reactions  . Demerol [Meperidine] Swelling  . Stadol [Butorphanol] Swelling    Review of Systems  Eyes: Positive for blurred vision.  Musculoskeletal: Positive for back pain, joint pain and myalgias.  Neurological: Negative for weakness.  Endo/Heme/Allergies: Negative for polydipsia and polyphagia.  Psychiatric/Behavioral: The patient is nervous/anxious.        Objective:    Physical Exam  Constitutional: She is oriented to person, place, and time. She appears well-developed and well-nourished.  HENT:  Head: Normocephalic and atraumatic.  Eyes: Pupils are equal, round, and  reactive to light. Conjunctivae and EOM are normal.  Cardiovascular: Normal rate, regular rhythm, normal heart sounds and intact distal pulses.  Pulmonary/Chest: Effort normal and breath sounds normal.  Abdominal: Soft. Bowel sounds are normal.  Neurological: She is alert and oriented to person, place, and time. She has normal reflexes.  Skin: Skin is warm and dry. No rash noted.  Psychiatric: She has a normal mood and affect. Her behavior is normal. Judgment and thought content normal.    BP (!) 154/77   Pulse 74   Temp (!) 97.5 F (36.4 C) (Oral)   Ht _0  (1.626 m)   Wt 159 lb 3.2 oz (72.2 kg)   BMI 27.33 kg/m  Wt Readings from Last 3 Encounters:  07/25/18 159 lb 3.2 oz (72.2 kg)  05/12/18 157 lb (71.2 kg)  04/26/18 156 lb (70.8 kg)    Health Maintenance Due  Topic Date Due  . TETANUS/TDAP  08/05/1969  . MAMMOGRAM  08/05/2000  . COLONOSCOPY  10/25/2012  . PNA vac Low Risk Adult (2 of 2 - PPSV23) 05/15/2016  . INFLUENZA VACCINE  04/06/2018       Lab Results  Component Value Date   TSH 3.830 05/12/2018   Lab Results  Component Value Date   WBC 7.5 05/12/2018   HGB 13.8 05/12/2018   HCT 43.2 05/12/2018   MCV 99 (H) 05/12/2018   PLT 260 05/12/2018   Lab Results  Component Value Date   NA 142 05/12/2018   K 4.7 05/12/2018   CO2 21 05/12/2018   GLUCOSE 219 (H) 05/12/2018   BUN 16 05/12/2018   CREATININE 0.74 05/12/2018   BILITOT <0.2 05/12/2018   ALKPHOS 70 05/12/2018   AST 19 05/12/2018   ALT 24 05/12/2018   PROT 7.2 05/12/2018   ALBUMIN 4.5 05/12/2018   CALCIUM 9.9 05/12/2018   Lab Results  Component Value Date   CHOL 166 11/02/2017   Lab Results  Component Value Date   HDL 48 11/02/2017   Lab Results  Component Value Date   LDLCALC 91 11/02/2017   Lab Results  Component Value Date   TRIG 134 11/02/2017   Lab Results  Component Value Date   CHOLHDL 3.5 11/02/2017  Assessment & Plan:   Problem List Items Addressed This Visit       Cardiovascular and Mediastinum   Essential hypertension - Primary   Relevant Medications   lisinopril (PRINIVIL,ZESTRIL) 20 MG tablet   hydrochlorothiazide (HYDRODIURIL) 25 MG tablet   fenofibrate micronized (LOFIBRA) 134 MG capsule   atorvastatin (LIPITOR) 10 MG tablet   Other Relevant Orders   BMP8+EGFR   Lipid panel   Bayer DCA Hb A1c Waived     Endocrine   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)   Relevant Medications   lisinopril (PRINIVIL,ZESTRIL) 20 MG tablet   liraglutide (VICTOZA) 18 MG/3ML SOPN   atorvastatin (LIPITOR) 10 MG tablet   Insulin Pen Needle (GLOBAL EASE INJECT PEN NEEDLES) 32G X 4 MM MISC   insulin aspart (NOVOLOG FLEXPEN) 100 UNIT/ML FlexPen   LANTUS SOLOSTAR 100 UNIT/ML Solostar Pen   Other Relevant Orders   BMP8+EGFR   Lipid panel   Bayer DCA Hb A1c Waived     Musculoskeletal and Integument   Cervical disc disease   Relevant Medications   oxycodone (ROXICODONE) 30 MG immediate release tablet   oxycodone (ROXICODONE) 30 MG immediate release tablet   oxycodone (ROXICODONE) 30 MG immediate release tablet   Degeneration of lumbar or lumbosacral intervertebral disc   Relevant Medications   oxycodone (ROXICODONE) 30 MG immediate release tablet   oxycodone (ROXICODONE) 30 MG immediate release tablet   oxycodone (ROXICODONE) 30 MG immediate release tablet   meloxicam (MOBIC) 7.5 MG tablet   metaxalone (SKELAXIN) 800 MG tablet   DDD (degenerative disc disease), lumbar   Relevant Medications   oxycodone (ROXICODONE) 30 MG immediate release tablet   oxycodone (ROXICODONE) 30 MG immediate release tablet   oxycodone (ROXICODONE) 30 MG immediate release tablet   meloxicam (MOBIC) 7.5 MG tablet   metaxalone (SKELAXIN) 800 MG tablet     Genitourinary   Vaginal atrophy   Relevant Medications   PREMARIN 0.625 MG tablet   conjugated estrogens (PREMARIN) vaginal cream     Other   Pain in joint involving multiple sites   Relevant  Medications   oxycodone (ROXICODONE) 30 MG immediate release tablet   oxycodone (ROXICODONE) 30 MG immediate release tablet   oxycodone (ROXICODONE) 30 MG immediate release tablet   Generalized anxiety disorder   Relevant Medications   ALPRAZolam (XANAX) 1 MG tablet   Fibrocystic breast changes       Meds ordered this encounter  Medications  . oxycodone (ROXICODONE) 30 MG immediate release tablet    Sig: Take 1 tablet (30 mg total) by mouth every 6 (six) hours.    Dispense:  120 tablet    Refill:  0    Fill 30 days from original script date    Order Specific Question:   Supervising Provider    Answer:   Janora Norlander [[id number]]  . oxycodone (ROXICODONE) 30 MG immediate release tablet    Sig: Take 1 tablet (30 mg total) by mouth every 4 (four) hours as needed for pain.    Dispense:  120 tablet    Refill:  0    Fill 60 days from original script date    Order Specific Question:   Supervising Provider    Answer:   Janora Norlander [[id number]]  . oxycodone (ROXICODONE) 30 MG immediate release tablet    Sig: Take 1 tablet (30 mg total) by mouth every 6 (six) hours as needed for pain.    Dispense:  120 tablet  Refill:  0    Order Specific Question:   Supervising Provider    Answer:   Janora Norlander [[id number]]  . ALPRAZolam (XANAX) 1 MG tablet    Sig: Take 1 tablet (1 mg total) by mouth 3 (three) times daily.    Dispense:  90 tablet    Refill:  5    Order Specific Question:   Supervising Provider    Answer:   Janora Norlander [[id number]]  . gabapentin (NEURONTIN) 600 MG tablet    Sig: Take 1 tablet (600 mg total) by mouth 3 (three) times daily.    Dispense:  90 tablet    Refill:  5    Order Specific Question:   Supervising Provider    Answer:   Janora Norlander [[id number]]  . lisinopril (PRINIVIL,ZESTRIL) 20 MG tablet    Sig: Take 1 tablet (20 mg total) by mouth daily.    Dispense:  90 tablet    Refill:  3    Order Specific Question:   Supervising Provider     Answer:   Janora Norlander [[id number]]  . pantoprazole (PROTONIX) 40 MG tablet    Sig: Take 1 tablet (40 mg total) by mouth daily.    Dispense:  30 tablet    Refill:  11    Order Specific Question:   Supervising Provider    Answer:   Janora Norlander [[id number]]  . liraglutide (VICTOZA) 18 MG/3ML SOPN    Sig: INJECT 0.3 MLS (1.8 MG TOTAL) INTO THE SKIN DAILY.    Dispense:  9 mL    Refill:  11    This prescription was filled on 06/30/2018. Any refills authorized will be placed on file.    Order Specific Question:   Supervising Provider    Answer:   Janora Norlander [[id number]]  . Vitamin D, Ergocalciferol, (DRISDOL) 1.25 MG (50000 UT) CAPS capsule    Sig: Take 1 capsule (50,000 Units total) by mouth once a week.    Dispense:  30 capsule    Refill:  11    Order Specific Question:   Supervising Provider    Answer:   Janora Norlander [[id number]]  . hydrochlorothiazide (HYDRODIURIL) 25 MG tablet    Sig: Take 1 tablet (25 mg total) by mouth daily.    Dispense:  30 tablet    Refill:  11    This prescription was filled on 03/26/2017. Any refills authorized will be placed on file.    Order Specific Question:   Supervising Provider    Answer:   Janora Norlander [[id number]]  . fenofibrate micronized (LOFIBRA) 134 MG capsule    Sig: Take 1 capsule (134 mg total) by mouth every morning.    Dispense:  30 capsule    Refill:  11    This prescription was filled on 09/12/2017. Any refills authorized will be placed on file.    Order Specific Question:   Supervising Provider    Answer:   Janora Norlander [[id number]]  . cetirizine (ZYRTEC) 10 MG tablet    Sig: TAKE 1 TABLET BY MOUTH AS NEEDED ONCE A DAY    Dispense:  30 tablet    Refill:  5    This prescription was filled on 02/01/2017. Any refills authorized will be placed on file.    Order Specific Question:   Supervising Provider    Answer:   Janora Norlander [[id number]]  . atorvastatin (LIPITOR) 10 MG tablet    Sig: Take 1 tablet (  10 mg  total) by mouth daily.    Dispense:  30 tablet    Refill:  11    Order Specific Question:   Supervising Provider    Answer:   Janora Norlander [[id number]]  . Insulin Pen Needle (GLOBAL EASE INJECT PEN NEEDLES) 32G X 4 MM MISC    Sig: USE WITH INSULIN PEN UP TO FIVE TIMES DAILY    Dispense:  100 each    Refill:  11    E11.9    Order Specific Question:   Supervising Provider    Answer:   Janora Norlander [[id number]]  . meloxicam (MOBIC) 7.5 MG tablet    Sig: Take 1 tablet (7.5 mg total) by mouth daily.    Dispense:  90 tablet    Refill:  3    Order Specific Question:   Supervising Provider    Answer:   Janora Norlander [[id number]]  . metaxalone (SKELAXIN) 800 MG tablet    Sig: Take 1 tablet (800 mg total) by mouth 3 (three) times daily.    Dispense:  90 tablet    Refill:  5    This prescription was filled on 09/12/2017. Any refills authorized will be placed on file.    Order Specific Question:   Supervising Provider    Answer:   Janora Norlander [[id number]]  . insulin aspart (NOVOLOG FLEXPEN) 100 UNIT/ML FlexPen    Sig: INJECT 10-20 UNITS UNDER SKIN BEFORE MEALS    Dispense:  15 mL    Refill:  11    This prescription was filled on 05/30/2018. Any refills authorized will be placed on file.    Order Specific Question:   Supervising Provider    Answer:   Janora Norlander [[id number]]  . LANTUS SOLOSTAR 100 UNIT/ML Solostar Pen    Sig: Inject 60 Units into the skin at bedtime.    Dispense:  30 mL    Refill:  12    Order Specific Question:   Supervising Provider    Answer:   Janora Norlander [[id number]]  . PREMARIN 0.625 MG tablet    Sig: Take 1 tablet (0.625 mg total) by mouth daily. Take daily for 21 days then do not take for 7 days.    Dispense:  30 tablet    Refill:  11    This prescription was filled on 03/02/2018. Any refills authorized will be placed on file.    Order Specific Question:   Supervising Provider    Answer:   Janora Norlander [[id number]]  . conjugated  estrogens (PREMARIN) vaginal cream    Sig: Place 1 Applicatorful vaginally daily.    Dispense:  42.5 g    Refill:  12    Order Specific Question:   Supervising Provider    Answer:   Janora Norlander [[id number]]     Terald Sleeper, PA-C

## 2018-07-26 LAB — BMP8+EGFR
BUN/Creatinine Ratio: 18 (ref 12–28)
BUN: 13 mg/dL (ref 8–27)
CO2: 18 mmol/L — ABNORMAL LOW (ref 20–29)
Calcium: 10.1 mg/dL (ref 8.7–10.3)
Chloride: 100 mmol/L (ref 96–106)
Creatinine, Ser: 0.74 mg/dL (ref 0.57–1.00)
GFR calc Af Amer: 97 mL/min/{1.73_m2} (ref >59)
GFR, EST NON AFRICAN AMERICAN: 84 mL/min/{1.73_m2} (ref >59)
Glucose: 248 mg/dL — ABNORMAL HIGH (ref 65–99)
POTASSIUM: 4.2 mmol/L (ref 3.5–5.2)
SODIUM: 136 mmol/L (ref 134–144)

## 2018-07-26 LAB — LIPID PANEL
Chol/HDL Ratio: 4 ratio (ref 0.0–4.4)
Cholesterol, Total: 197 mg/dL (ref 100–199)
HDL: 49 mg/dL (ref >39)
LDL Calculated: 110 mg/dL — ABNORMAL HIGH (ref 0–99)
Triglycerides: 188 mg/dL — ABNORMAL HIGH (ref 0–149)
VLDL Cholesterol Cal: 38 mg/dL (ref 5–40)

## 2018-08-11 ENCOUNTER — Other Ambulatory Visit: Payer: Self-pay | Admitting: Physician Assistant

## 2018-08-16 DIAGNOSIS — R922 Inconclusive mammogram: Secondary | ICD-10-CM | POA: Diagnosis not present

## 2018-08-16 DIAGNOSIS — R928 Other abnormal and inconclusive findings on diagnostic imaging of breast: Secondary | ICD-10-CM | POA: Diagnosis not present

## 2018-08-16 DIAGNOSIS — N6011 Diffuse cystic mastopathy of right breast: Secondary | ICD-10-CM | POA: Diagnosis not present

## 2018-08-16 LAB — HM MAMMOGRAPHY

## 2018-08-18 ENCOUNTER — Telehealth: Payer: Self-pay | Admitting: Physician Assistant

## 2018-08-18 NOTE — Telephone Encounter (Signed)
Pt is calling to get cologuard results, no results received in paper

## 2018-10-17 ENCOUNTER — Telehealth: Payer: Self-pay | Admitting: Physician Assistant

## 2018-10-17 NOTE — Telephone Encounter (Signed)
PT can't get in until the 18th to see AJ and she has been without her oxycodone since last Tuesday and she can barely walk due to pain in her back. Pt is wanting to know what she can do or what we can do.

## 2018-10-17 NOTE — Telephone Encounter (Signed)
Patient aware and verbalizes understanding. Apt made 2/12 with [facility].

## 2018-10-17 NOTE — Telephone Encounter (Signed)
She will have to go to the ED and try to get an appointment in the coming week with me.  I cannot fill anything outside of her regular visit.  She also needs to return to neuro/ortho for her back if it worsening that much.

## 2018-10-18 ENCOUNTER — Encounter: Payer: Self-pay | Admitting: Physician Assistant

## 2018-10-18 ENCOUNTER — Ambulatory Visit (INDEPENDENT_AMBULATORY_CARE_PROVIDER_SITE_OTHER): Payer: Medicare Other | Admitting: Physician Assistant

## 2018-10-18 VITALS — BP 135/95 | HR 95 | Ht 64.0 in | Wt 155.4 lb

## 2018-10-18 DIAGNOSIS — Z0283 Encounter for blood-alcohol and blood-drug test: Secondary | ICD-10-CM

## 2018-10-18 DIAGNOSIS — M5090 Cervical disc disorder, unspecified, unspecified cervical region: Secondary | ICD-10-CM | POA: Diagnosis not present

## 2018-10-18 DIAGNOSIS — M2550 Pain in unspecified joint: Secondary | ICD-10-CM | POA: Diagnosis not present

## 2018-10-18 DIAGNOSIS — M5137 Other intervertebral disc degeneration, lumbosacral region: Secondary | ICD-10-CM

## 2018-10-18 DIAGNOSIS — M5136 Other intervertebral disc degeneration, lumbar region: Secondary | ICD-10-CM

## 2018-10-18 DIAGNOSIS — Z79891 Long term (current) use of opiate analgesic: Secondary | ICD-10-CM | POA: Diagnosis not present

## 2018-10-18 MED ORDER — OXYCODONE HCL 30 MG PO TABS: 30.0000 mg | ORAL_TABLET | Freq: Four times a day (QID) | ORAL | 0 refills | Status: DC | PRN

## 2018-10-18 MED ORDER — OXYCODONE HCL 30 MG PO TABS: 30.0000 mg | ORAL_TABLET | Freq: Four times a day (QID) | ORAL | 0 refills | Status: DC

## 2018-10-18 MED ORDER — METHYLPREDNISOLONE ACETATE 80 MG/ML IJ SUSP
80.0000 mg | Freq: Once | INTRAMUSCULAR | Status: AC
  Administered 2018-10-18: 80 mg via INTRAMUSCULAR

## 2018-10-18 MED ORDER — OXYCODONE HCL 30 MG PO TABS: 30.0000 mg | ORAL_TABLET | ORAL | 0 refills | Status: DC | PRN

## 2018-10-19 ENCOUNTER — Telehealth: Payer: Self-pay | Admitting: Physician Assistant

## 2018-10-19 DIAGNOSIS — M5432 Sciatica, left side: Secondary | ICD-10-CM

## 2018-10-19 DIAGNOSIS — R200 Anesthesia of skin: Secondary | ICD-10-CM

## 2018-10-19 DIAGNOSIS — M5137 Other intervertebral disc degeneration, lumbosacral region: Secondary | ICD-10-CM

## 2018-10-19 DIAGNOSIS — M8588 Other specified disorders of bone density and structure, other site: Secondary | ICD-10-CM

## 2018-10-19 DIAGNOSIS — M2550 Pain in unspecified joint: Secondary | ICD-10-CM

## 2018-10-19 DIAGNOSIS — M5136 Other intervertebral disc degeneration, lumbar region: Secondary | ICD-10-CM

## 2018-10-19 NOTE — Telephone Encounter (Signed)
Cane ordered.

## 2018-10-19 NOTE — Progress Notes (Addendum)
BP (!) 135/95   Pulse 95   Ht 5\' 4"  (1.626 m)   Wt 155 lb 6.4 oz (70.5 kg)   BMI 26.67 kg/m    Subjective:    Patient ID: Caitlin Pennington, female    DOB: May 08, 1951, 68 y.o.   MRN: [id number]  HPI: Caitlin Pennington is a 68 y.o. female presenting on 10/18/2018 for Hypertension; Diabetes; and Pain  This patient comes in for periodic recheck on medications and conditions including DDD cervical, lumbar, chronic pain.  Caitlin Pennington also has multiple OA  affected joints.  Caitlin Pennington recently retired and was hoping to get everything settled down with her diabetes and somatic complaints. However just 2 weeks ago Caitlin Pennington had another flare of her lumbar spine and radiation pain.  Caitlin Pennington ran out of her pain medications last week and has been on just OTC without relief.  Neurosurgery last year did not say they could operate because of her other medical conditions.    We have discussed the medical board regulations for her medications of pain and benzodiazepines.  In addtion her MME is 180.  We need to make a pain clinic referral for her to handle her chronic needs. We will refill medications for this quarter and plan for the neurologist to take over her.  Caitlin Pennington does have a contract on file and drug screen performed today.   This patient returns for a 6 month recheck on narcotic use for DDD throughout spine and DJD and GAD and medication refills  Patient currently taking oxycodone 30 mg QID, gabapentin 600 mg TID, Skelaxin as needed and tolerated alprazolam  . Behavior- normal Medication side effects- none Any concerns- quantity of medication  PMP AWARE website reviewed: Yes Any suspicious activity on PMP Aware: No    All medications are reviewed today. There are no reports of any problems with the medications. All of the medical conditions are reviewed and updated.  Lab work is reviewed and will be ordered as medically necessary. There are no new problems reported with today's visit.   Past Medical History:  Diagnosis  Date  . Anxiety   . Back pain   . Diabetes mellitus without complication (HCC)   . Hyperlipidemia   . Hypertension   . Neuropathy    Relevant past medical, surgical, family and social history reviewed and updated as indicated. Interim medical history since our last visit reviewed. Allergies and medications reviewed and updated. DATA REVIEWED: CHART IN EPIC  Family History reviewed for pertinent findings.  Review of Systems  Constitutional: Positive for fatigue. Negative for activity change and fever.  HENT: Negative.   Eyes: Negative.   Respiratory: Negative.  Negative for cough.   Cardiovascular: Negative.  Negative for chest pain.  Gastrointestinal: Negative.  Negative for abdominal pain.  Endocrine: Negative.   Genitourinary: Negative.  Negative for dysuria.  Musculoskeletal: Positive for arthralgias, back pain, gait problem, myalgias, neck pain and neck stiffness.  Skin: Negative.     Allergies as of 10/18/2018      Reactions   Demerol [meperidine] Swelling   Stadol [butorphanol] Swelling      Medication List       Accurate as of October 18, 2018 11:59 PM. Always use your most recent med list.        ALPRAZolam 1 MG tablet Commonly known as:  XANAX Take 1 tablet (1 mg total) by mouth 3 (three) times daily.   atorvastatin 10 MG tablet Commonly known as:  LIPITOR Take 1 tablet (10 mg  total) by mouth daily.   cetirizine 10 MG tablet Commonly known as:  ZYRTEC TAKE 1 TABLET BY MOUTH AS NEEDED ONCE A DAY   clobetasol cream 0.05 % Commonly known as:  TEMOVATE Apply 1 application topically 2 (two) times daily.   conjugated estrogens vaginal cream Commonly known as:  PREMARIN Place 1 Applicatorful vaginally daily.   fenofibrate micronized 134 MG capsule Commonly known as:  LOFIBRA Take 1 capsule (134 mg total) by mouth every morning.   fluconazole 150 MG tablet Commonly known as:  DIFLUCAN Take 1 tablet (150 mg total) by mouth daily.   gabapentin 600 MG  tablet Commonly known as:  NEURONTIN Take 1 tablet (600 mg total) by mouth 3 (three) times daily.   GLOBAL EASE INJECT PEN NEEDLES 32G X 4 MM Misc Generic drug:  Insulin Pen Needle USE WITH INSULIN PEN UP TO FIVE TIMES DAILY   hydrochlorothiazide 25 MG tablet Commonly known as:  HYDRODIURIL Take 1 tablet (25 mg total) by mouth daily.   LANTUS SOLOSTAR 100 UNIT/ML Solostar Pen Generic drug:  Insulin Glargine Inject 60 Units into the skin at bedtime.   liraglutide 18 MG/3ML Sopn Commonly known as:  VICTOZA INJECT 0.3 MLS (1.8 MG TOTAL) INTO THE SKIN DAILY.   lisinopril 20 MG tablet Commonly known as:  PRINIVIL,ZESTRIL Take 1 tablet (20 mg total) by mouth daily.   meloxicam 7.5 MG tablet Commonly known as:  MOBIC Take 1 tablet (7.5 mg total) by mouth daily.   metaxalone 800 MG tablet Commonly known as:  SKELAXIN Take 1 tablet (800 mg total) by mouth 3 (three) times daily.   NOVOLOG FLEXPEN 100 UNIT/ML FlexPen Generic drug:  insulin aspart INJECT 10-20 UNITS UNDER SKIN BEFORE MEALS   oxycodone 30 MG immediate release tablet Commonly known as:  ROXICODONE Take 1 tablet (30 mg total) by mouth every 6 (six) hours.   oxycodone 30 MG immediate release tablet Commonly known as:  ROXICODONE Take 1 tablet (30 mg total) by mouth every 4 (four) hours as needed for pain.   oxycodone 30 MG immediate release tablet Commonly known as:  ROXICODONE Take 1 tablet (30 mg total) by mouth every 6 (six) hours as needed for pain.   pantoprazole 40 MG tablet Commonly known as:  PROTONIX Take 1 tablet (40 mg total) by mouth daily.   PREMARIN 0.625 MG tablet Generic drug:  estrogens (conjugated) Take 1 tablet (0.625 mg total) by mouth daily. Take daily for 21 days then do not take for 7 days.   rOPINIRole 0.5 MG tablet Commonly known as:  REQUIP Take 1-2 tablets (0.5-1 mg total) by mouth at bedtime.   Vitamin D (Ergocalciferol) 1.25 MG (50000 UT) Caps capsule Commonly known as:   DRISDOL Take 1 capsule (50,000 Units total) by mouth once a week.          Objective:    BP (!) 135/95   Pulse 95   Ht 5\' 4"  (1.626 m)   Wt 155 lb 6.4 oz (70.5 kg)   BMI 26.67 kg/m   Allergies  Allergen Reactions  . Demerol [Meperidine] Swelling  . Stadol [Butorphanol] Swelling    Wt Readings from Last 3 Encounters:  10/18/18 155 lb 6.4 oz (70.5 kg)  07/25/18 159 lb 3.2 oz (72.2 kg)  05/12/18 157 lb (71.2 kg)    Physical Exam Constitutional:      Appearance: Caitlin Pennington is well-developed.  HENT:     Head: Normocephalic and atraumatic.  Eyes:  Conjunctiva/sclera: Conjunctivae normal.     Pupils: Pupils are equal, round, and reactive to light.  Cardiovascular:     Rate and Rhythm: Normal rate and regular rhythm.     Heart sounds: Normal heart sounds.  Pulmonary:     Effort: Pulmonary effort is normal.     Breath sounds: Normal breath sounds.  Abdominal:     General: Bowel sounds are normal.     Palpations: Abdomen is soft.  Musculoskeletal:     Cervical back: Caitlin Pennington exhibits decreased range of motion and tenderness.     Lumbar back: Caitlin Pennington exhibits decreased range of motion, tenderness, pain and spasm.       Back:  Skin:    General: Skin is warm and dry.     Findings: No rash.  Neurological:     Mental Status: Caitlin Pennington is alert and oriented to person, place, and time.     Deep Tendon Reflexes: Reflexes are normal and symmetric.  Psychiatric:        Behavior: Behavior normal.        Thought Content: Thought content normal.        Judgment: Judgment normal.         Assessment & Plan:   1. Degeneration of lumbar or lumbosacral intervertebral disc - Ambulatory referral to Pain Clinic - oxycodone (ROXICODONE) 30 MG immediate release tablet; Take 1 tablet (30 mg total) by mouth every 6 (six) hours.  Dispense: 120 tablet; Refill: 0 - oxycodone (ROXICODONE) 30 MG immediate release tablet; Take 1 tablet (30 mg total) by mouth every 6 (six) hours as needed for pain.  Dispense:  120 tablet; Refill: 0 - methylPREDNISolone acetate (DEPO-MEDROL) injection 80 mg - oxycodone (ROXICODONE) 30 MG immediate release tablet; Take 1 tablet (30 mg total) by mouth every 6 (six) hours as needed for pain.  Dispense: 120 tablet; Refill: 0  2. Encounter for drug screening - ToxASSURE Select 13 (MW), Urine  3. DDD (degenerative disc disease), lumbar - ToxASSURE Select 13 (MW), Urine - Ambulatory referral to Pain Clinic - oxycodone (ROXICODONE) 30 MG immediate release tablet; Take 1 tablet (30 mg total) by mouth every 6 (six) hours.  Dispense: 120 tablet; Refill: 0 - oxycodone (ROXICODONE) 30 MG immediate release tablet; Take 1 tablet (30 mg total) by mouth every 6 (six) hours as needed for pain.  Dispense: 120 tablet; Refill: 0 - oxycodone (ROXICODONE) 30 MG immediate release tablet; Take 1 tablet (30 mg total) by mouth every 6 (six) hours as needed for pain.  Dispense: 120 tablet; Refill: 0  4. Cervical disc disease - oxycodone (ROXICODONE) 30 MG immediate release tablet; Take 1 tablet (30 mg total) by mouth every 6 (six) hours.  Dispense: 120 tablet; Refill: 0 - oxycodone (ROXICODONE) 30 MG immediate release tablet; Take 1 tablet (30 mg total) by mouth every 6 (six) hours as needed for pain.  Dispense: 120 tablet; Refill: 0 - methylPREDNISolone acetate (DEPO-MEDROL) injection 80 mg - oxycodone (ROXICODONE) 30 MG immediate release tablet; Take 1 tablet (30 mg total) by mouth every 6 (six) hours as needed for pain.  Dispense: 120 tablet; Refill: 0  5. Pain in joint involving multiple sites - oxycodone (ROXICODONE) 30 MG immediate release tablet; Take 1 tablet (30 mg total) by mouth every 6 (six) hours.  Dispense: 120 tablet; Refill: 0 - oxycodone (ROXICODONE) 30 MG immediate release tablet; Take 1 tablet (30 mg total) by mouth every 6 (six) hours as needed for pain.  Dispense: 120 tablet; Refill: 0 -  methylPREDNISolone acetate (DEPO-MEDROL) injection 80 mg - oxycodone (ROXICODONE) 30  MG immediate release tablet; Take 1 tablet (30 mg total) by mouth every 6 (six) hours as needed for pain.  Dispense: 120 tablet; Refill: 0     Continue all other maintenance medications as listed above.  Follow up plan: No follow-ups on file.  Educational handout given for survey  Remus Loffler PA-C [facility] [address]  [place], [place] [address] [phone]   10/19/2018, 9:59 PM

## 2018-10-19 NOTE — Telephone Encounter (Signed)
Fine to order cane

## 2018-10-20 ENCOUNTER — Other Ambulatory Visit: Payer: Self-pay | Admitting: Physician Assistant

## 2018-10-20 MED ORDER — OXYCODONE HCL 30 MG PO TABS: 30.0000 mg | ORAL_TABLET | Freq: Four times a day (QID) | ORAL | 0 refills | Status: DC | PRN

## 2018-10-20 NOTE — Addendum Note (Signed)
Addended by: Remus Loffler on: 10/20/2018 08:07 AM   Modules accepted: Orders

## 2018-10-20 NOTE — Progress Notes (Signed)
Chart sent to me for review.  I agree with referral to pain clinic since patient is not a good candidate for neurosurgical intervention and is on high-dose opioid.  We discussed ways to pursue alternative management of anxiety.  Would be interested to see if patient would do well on an SSRI plus or minus BuSpar and wean off of benzodiazepine.  This would certainly decrease risk of adverse events, particularly given concomitant treatment with opioids.  Additionally, may consider also weaning off of exogenous estrogens given age.  Patient to see PCP soon for further eval

## 2018-10-20 NOTE — Telephone Encounter (Signed)
No answer, no voicemail.

## 2018-10-23 LAB — TOXASSURE SELECT 13 (MW), URINE

## 2018-10-24 ENCOUNTER — Ambulatory Visit: Payer: Medicare Other | Admitting: Physician Assistant

## 2018-10-31 ENCOUNTER — Other Ambulatory Visit: Payer: Self-pay | Admitting: Physician Assistant

## 2018-10-31 ENCOUNTER — Telehealth: Payer: Self-pay | Admitting: Physician Assistant

## 2018-10-31 DIAGNOSIS — M5137 Other intervertebral disc degeneration, lumbosacral region: Secondary | ICD-10-CM

## 2018-10-31 DIAGNOSIS — Z9181 History of falling: Secondary | ICD-10-CM

## 2018-10-31 DIAGNOSIS — M51379 Other intervertebral disc degeneration, lumbosacral region without mention of lumbar back pain or lower extremity pain: Secondary | ICD-10-CM

## 2018-10-31 DIAGNOSIS — M5432 Sciatica, left side: Secondary | ICD-10-CM

## 2018-10-31 MED ORDER — NALOXONE HCL 4 MG/0.1ML NA LIQD: 1.0000 | Freq: Once | NASAL | 5 refills | Status: AC

## 2018-10-31 MED ORDER — DULOXETINE HCL 30 MG PO CPEP: 30.0000 mg | ORAL_CAPSULE | Freq: Every day | ORAL | 3 refills | Status: DC

## 2018-10-31 NOTE — Telephone Encounter (Signed)
Spoke with patient about drug screen. Reports that she was off her xanax for 4 days or more, she states she has a few left over each month.    [facility].    A new narcan script is sent and asked to be placed on file. She states that she had one at home.    The new goal on xanax is to reduce by 30 tablets this next month.  She will start on cymbalta 30 mg one daily for 1 week and then increase to 60 mg daily.   Order for 4 prong cane is requested and order sent.

## 2018-12-06 ENCOUNTER — Telehealth: Payer: Self-pay | Admitting: Physician Assistant

## 2018-12-06 NOTE — Telephone Encounter (Signed)
I was able to speak with [facility] in [facility]. The patient was seen there on November 23, 2018. She was seen by Merryl Hacker, nurse practitioner in their clinic. They would not be prescribing on the first visit which obviously has passed. Her next appointment is on April 27. The last time I filled Caitlin Pennington prescriptions they were sent in on February 12 for 3 months. Therefore she would have them available to be filled February 12, March 13, April 12 prescriptions available to her. Since she will not be getting the medication from [facility] pain clinic until the 27th or after, I will leave the final one on file.    Remus Loffler PA-C [facility] [address]  [place], [place] [address] [phone]

## 2018-12-27 NOTE — Progress Notes (Signed)
Patient had oxycodone refilled on February 12, March 13, April 13 Last alprazolam prescription was on 10/20/2018, #120   MME 180 LME 0     BP (!) 169/82   Pulse 79   Temp (!) 97.2 F (36.2 C) (Oral)   Ht '5\' 4"'  (1.626 m)   Wt 154 lb 6.4 oz (70 kg)   BMI 26.50 kg/m    Subjective:    Patient ID: Caitlin Pennington, female    DOB: 1950-10-03, 68 y.o.   MRN: [id number]  HPI: Caitlin Pennington is a 68 y.o. female presenting on 12/28/2018 for Diabetes (3 month ); Pain; and knot on left wrist  This patient for periodic recheck.  She is getting set up with [facility] in [place].  We had been trying to get her established with them for her chronic pain management.  They had ordered an MRI but due to COVID-19 it has been canceled.  She does have a follow-up appointment in a few weeks with the clinic but is not sure if it will happen or not.  So at this time we are going to refill her chronic medications at the same level that she has been out.  She is trying to lower her medications.  She is doing this slowly.  She has completely tapered herself off of her alprazolam.  This was a stipulation that the pain clinic needed.  He is still taking Cymbalta at the 60 mg dose.  She is tolerating it well.  She cannot really tell if it is helping with the pain management yet.  She is still taking gabapentin 600 mg 3 times a day.  She and I discussed that I would send this note to her specialist so they are aware of the prescriptions given to her by our office.  We had discussed doing a Cologuard for her colon cancer screening.  We will look into that and getting it set up if not we will have her go for a gastroenterology visit.  She did have her mammogram performed 6 months ago and is due to go for 15-monthfollow-up.  She states that her diabetes has been doing a lot better she has been very careful with her diet and we will plan to have labs performed in the near future.  Past Medical History:  Diagnosis Date   . Anxiety   . Back pain   . [place]   . Hyperlipidemia   . Hypertension   . Neuropathy    Relevant past medical, surgical, family and social history reviewed and updated as indicated. Interim medical history since our last visit reviewed. Allergies and medications reviewed and updated. DATA REVIEWED: CHART IN EPIC  Family History reviewed for pertinent findings.  Review of Systems  Constitutional: Negative.  Negative for activity change, fatigue and fever.  HENT: Negative.   Eyes: Negative.   Respiratory: Negative.  Negative for cough.   Cardiovascular: Negative.  Negative for chest pain.  Gastrointestinal: Negative.  Negative for abdominal pain.  Endocrine: Negative.   Genitourinary: Negative.  Negative for dysuria.  Musculoskeletal: Positive for arthralgias, back pain and myalgias.  Skin: Negative.   Neurological: Negative.   Psychiatric/Behavioral: Positive for sleep disturbance. The patient is nervous/anxious.     Allergies as of 12/28/2018      Reactions   Demerol [meperidine] Swelling   Stadol [butorphanol] Swelling      Medication List       Accurate as of December 28, 2018 11:59  PM. Always use your most recent med list.        atorvastatin 10 MG tablet Commonly known as:  LIPITOR Take 1 tablet (10 mg total) by mouth daily.   busPIRone 10 MG tablet Commonly known as:  BUSPAR Take 1 tablet (10 mg total) by mouth 3 (three) times daily as needed.   cetirizine 10 MG tablet Commonly known as:  ZYRTEC TAKE 1 TABLET BY MOUTH AS NEEDED ONCE A DAY   clobetasol cream 0.05 % Commonly known as:  TEMOVATE Apply 1 application topically 2 (two) times daily.   conjugated estrogens vaginal cream Commonly known as:  PREMARIN Place 1 Applicatorful vaginally daily.   DULoxetine 30 MG capsule Commonly known as:  Cymbalta Take 1-2 capsules (30-60 mg total) by mouth daily. Take one daily 1 week, then go up to 60 mg daily   fenofibrate  micronized 134 MG capsule Commonly known as:  LOFIBRA Take 1 capsule (134 mg total) by mouth every morning.   fluconazole 150 MG tablet Commonly known as:  DIFLUCAN Take 1 tablet (150 mg total) by mouth daily.   gabapentin 600 MG tablet Commonly known as:  Neurontin Take 1 tablet (600 mg total) by mouth 3 (three) times daily.   Global Ease Inject Pen Needles 32G X 4 MM Misc Generic drug:  Insulin Pen Needle USE WITH INSULIN PEN UP TO FIVE TIMES DAILY   hydrochlorothiazide 25 MG tablet Commonly known as:  HYDRODIURIL Take 1 tablet (25 mg total) by mouth daily.   Lantus SoloStar 100 UNIT/ML Solostar Pen Generic drug:  Insulin Glargine Inject 60 Units into the skin at bedtime.   liraglutide 18 MG/3ML Sopn Commonly known as:  Victoza INJECT 0.3 MLS (1.8 MG TOTAL) INTO THE SKIN DAILY.   lisinopril 20 MG tablet Commonly known as:  ZESTRIL Take 1 tablet (20 mg total) by mouth daily.   meloxicam 7.5 MG tablet Commonly known as:  MOBIC Take 1 tablet (7.5 mg total) by mouth daily.   metaxalone 800 MG tablet Commonly known as:  SKELAXIN Take 1 tablet (800 mg total) by mouth 3 (three) times daily.   NovoLOG FlexPen 100 UNIT/ML FlexPen Generic drug:  insulin aspart INJECT 10-20 UNITS UNDER SKIN BEFORE MEALS   oxycodone 30 MG immediate release tablet Commonly known as:  ROXICODONE Take 1 tablet (30 mg total) by mouth every 6 (six) hours.   oxycodone 30 MG immediate release tablet Commonly known as:  ROXICODONE Take 1 tablet (30 mg total) by mouth every 6 (six) hours as needed for pain.   oxycodone 30 MG immediate release tablet Commonly known as:  ROXICODONE Take 1 tablet (30 mg total) by mouth every 6 (six) hours as needed for pain.   pantoprazole 40 MG tablet Commonly known as:  PROTONIX Take 1 tablet (40 mg total) by mouth daily.   Premarin 0.625 MG tablet Generic drug:  estrogens (conjugated) Take 1 tablet (0.625 mg total) by mouth daily. Take daily for 21 days  then do not take for 7 days.   rOPINIRole 0.5 MG tablet Commonly known as:  Requip Take 1-2 tablets (0.5-1 mg total) by mouth at bedtime.   Vitamin D (Ergocalciferol) 1.25 MG (50000 UT) Caps capsule Commonly known as:  DRISDOL Take 1 capsule (50,000 Units total) by mouth once a week.          Objective:    BP (!) 169/82   Pulse 79   Temp (!) 97.2 F (36.2 C) (Oral)   Ht 5'  4" (1.626 m)   Wt 154 lb 6.4 oz (70 kg)   BMI 26.50 kg/m   Allergies  Allergen Reactions  . Demerol [Meperidine] Swelling  . Stadol [Butorphanol] Swelling    Wt Readings from Last 3 Encounters:  12/28/18 154 lb 6.4 oz (70 kg)  10/18/18 155 lb 6.4 oz (70.5 kg)  07/25/18 159 lb 3.2 oz (72.2 kg)    Physical Exam Constitutional:      Appearance: She is well-developed.  HENT:     Head: Normocephalic and atraumatic.  Eyes:     Conjunctiva/sclera: Conjunctivae normal.     Pupils: Pupils are equal, round, and reactive to light.  Cardiovascular:     Rate and Rhythm: Normal rate and regular rhythm.     Heart sounds: Normal heart sounds.  Pulmonary:     Effort: Pulmonary effort is normal.     Breath sounds: Normal breath sounds.  Abdominal:     General: Bowel sounds are normal.     Palpations: Abdomen is soft.  Musculoskeletal:     Lumbar back: She exhibits decreased range of motion, tenderness, pain and spasm.       Back:  Skin:    General: Skin is warm and dry.     Findings: No rash.  Neurological:     Mental Status: She is alert and oriented to person, place, and time.     Deep Tendon Reflexes: Reflexes are normal and symmetric.  Psychiatric:        Behavior: Behavior normal.        Thought Content: Thought content normal.        Judgment: Judgment normal.     Results for orders placed or performed in visit on 12/28/18  CBC with Differential/Platelet  Result Value Ref Range   WBC 8.4 3.4 - 10.8 x10E3/uL   RBC 4.60 3.77 - 5.28 x10E6/uL   Hemoglobin 15.1 11.1 - 15.9 g/dL   Hematocrit  42.8 34.0 - 46.6 %   MCV 93 79 - 97 fL   MCH 32.8 26.6 - 33.0 pg   MCHC 35.3 31.5 - 35.7 g/dL   RDW 12.4 11.7 - 15.4 %   Platelets 310 150 - 450 x10E3/uL   Neutrophils 66 Not Estab. %   Lymphs 21 Not Estab. %   Monocytes 10 Not Estab. %   Eos 2 Not Estab. %   Basos 1 Not Estab. %   Neutrophils Absolute 5.5 1.4 - 7.0 x10E3/uL   Lymphocytes Absolute 1.8 0.7 - 3.1 x10E3/uL   Monocytes Absolute 0.8 0.1 - 0.9 x10E3/uL   EOS (ABSOLUTE) 0.2 0.0 - 0.4 x10E3/uL   Basophils Absolute 0.1 0.0 - 0.2 x10E3/uL   Immature Granulocytes 0 Not Estab. %   Immature Grans (Abs) 0.0 0.0 - 0.1 x10E3/uL  CMP14+EGFR  Result Value Ref Range   Glucose 191 (H) 65 - 99 mg/dL   BUN 9 8 - 27 mg/dL   Creatinine, Ser 0.67 0.57 - 1.00 mg/dL   GFR calc non Af Amer 91 >59 mL/min/1.73   GFR calc Af Amer 104 >59 mL/min/1.73   BUN/Creatinine Ratio 13 12 - 28   Sodium 140 134 - 144 mmol/L   Potassium 4.7 3.5 - 5.2 mmol/L   Chloride 98 96 - 106 mmol/L   CO2 24 20 - 29 mmol/L   Calcium 10.1 8.7 - 10.3 mg/dL   Total Protein 7.2 6.0 - 8.5 g/dL   Albumin 4.7 3.8 - 4.8 g/dL   Globulin, Total 2.5 1.5 -  4.5 g/dL   Albumin/Globulin Ratio 1.9 1.2 - 2.2   Bilirubin Total 0.3 0.0 - 1.2 mg/dL   Alkaline Phosphatase 60 39 - 117 IU/L   AST 24 0 - 40 IU/L   ALT 25 0 - 32 IU/L  Microalbumin / creatinine urine ratio  Result Value Ref Range   Creatinine, Urine 116.6 Not Estab. mg/dL   Microalbumin, Urine 391.4 Not Estab. ug/mL   Microalb/Creat Ratio 336 (H) 0 - 29 mg/g creat  Bayer DCA Hb A1c Waived  Result Value Ref Range   HB A1C (BAYER DCA - WAIVED) 7.4 (H) <7.0 %      Assessment & Plan:   1. Cervical disc disease Forward note to [facility] Patient awaiting MRI Awaiting future visits with the clinic - gabapentin (NEURONTIN) 600 MG tablet; Take 1 tablet (600 mg total) by mouth 3 (three) times daily.  Dispense: 90 tablet; Refill: 5 - oxycodone (ROXICODONE) 30 MG immediate release tablet; Take 1 tablet (30 mg  total) by mouth every 6 (six) hours.  Dispense: 120 tablet; Refill: 0 - oxycodone (ROXICODONE) 30 MG immediate release tablet; Take 1 tablet (30 mg total) by mouth every 6 (six) hours as needed for pain.  Dispense: 120 tablet; Refill: 0 - oxycodone (ROXICODONE) 30 MG immediate release tablet; Take 1 tablet (30 mg total) by mouth every 6 (six) hours as needed for pain.  Dispense: 120 tablet; Refill: 0  2. Degeneration of lumbar or lumbosacral intervertebral disc Forward note to [facility] Patient awaiting MRI Awaiting future visits with the clinic - gabapentin (NEURONTIN) 600 MG tablet; Take 1 tablet (600 mg total) by mouth 3 (three) times daily.  Dispense: 90 tablet; Refill: 5 - oxycodone (ROXICODONE) 30 MG immediate release tablet; Take 1 tablet (30 mg total) by mouth every 6 (six) hours.  Dispense: 120 tablet; Refill: 0 - oxycodone (ROXICODONE) 30 MG immediate release tablet; Take 1 tablet (30 mg total) by mouth every 6 (six) hours as needed for pain.  Dispense: 120 tablet; Refill: 0 - oxycodone (ROXICODONE) 30 MG immediate release tablet; Take 1 tablet (30 mg total) by mouth every 6 (six) hours as needed for pain.  Dispense: 120 tablet; Refill: 0  3. Pain in joint involving multiple sites Forward note to [facility] Patient awaiting MRI Awaiting future visits with the clinic - oxycodone (ROXICODONE) 30 MG immediate release tablet; Take 1 tablet (30 mg total) by mouth every 6 (six) hours.  Dispense: 120 tablet; Refill: 0 - oxycodone (ROXICODONE) 30 MG immediate release tablet; Take 1 tablet (30 mg total) by mouth every 6 (six) hours as needed for pain.  Dispense: 120 tablet; Refill: 0 - oxycodone (ROXICODONE) 30 MG immediate release tablet; Take 1 tablet (30 mg total) by mouth every 6 (six) hours as needed for pain.  Dispense: 120 tablet; Refill: 0  4. DDD (degenerative disc disease), lumbar Forward note to [facility] Patient awaiting MRI Awaiting future visits  with the clinic - oxycodone (ROXICODONE) 30 MG immediate release tablet; Take 1 tablet (30 mg total) by mouth every 6 (six) hours.  Dispense: 120 tablet; Refill: 0 - oxycodone (ROXICODONE) 30 MG immediate release tablet; Take 1 tablet (30 mg total) by mouth every 6 (six) hours as needed for pain.  Dispense: 120 tablet; Refill: 0 - oxycodone (ROXICODONE) 30 MG immediate release tablet; Take 1 tablet (30 mg total) by mouth every 6 (six) hours as needed for pain.  Dispense: 120 tablet; Refill: 0  5. 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 Generalized anxiety disorder - busPIRone (BUSPAR) 10 MG tablet; Take 1 tablet (10 mg total) by mouth 3 (three) times daily as needed.  Dispense: 60 tablet; Refill: 5 Patient has tapered off of alprazolam at this time.  Continue all other maintenance medications as listed above.  Follow up plan: Recheck in 3 months  Educational handout given for [place] PA-C [place] [address]  [place], [place] [address] [phone]   01/01/2019, 2:49 PM

## 2018-12-28 ENCOUNTER — Ambulatory Visit (INDEPENDENT_AMBULATORY_CARE_PROVIDER_SITE_OTHER): Payer: Medicare PPO | Admitting: Physician Assistant

## 2018-12-28 ENCOUNTER — Encounter: Payer: Self-pay | Admitting: Physician Assistant

## 2018-12-28 ENCOUNTER — Other Ambulatory Visit: Payer: Self-pay

## 2018-12-28 VITALS — BP 169/82 | HR 79 | Temp 97.2°F | Ht 64.0 in | Wt 154.4 lb

## 2018-12-28 DIAGNOSIS — E1039 Type 1 diabetes mellitus with other diabetic ophthalmic complication: Secondary | ICD-10-CM

## 2018-12-28 DIAGNOSIS — M5090 Cervical disc disorder, unspecified, unspecified cervical region: Secondary | ICD-10-CM

## 2018-12-28 DIAGNOSIS — M2550 Pain in unspecified joint: Secondary | ICD-10-CM | POA: Diagnosis not present

## 2018-12-28 DIAGNOSIS — M5136 Other intervertebral disc degeneration, lumbar region: Secondary | ICD-10-CM

## 2018-12-28 DIAGNOSIS — M5137 Other intervertebral disc degeneration, lumbosacral region: Secondary | ICD-10-CM

## 2018-12-28 DIAGNOSIS — F411 Generalized anxiety disorder: Secondary | ICD-10-CM

## 2018-12-28 LAB — BAYER DCA HB A1C WAIVED: HB A1C (BAYER DCA - WAIVED): 7.4 % — ABNORMAL HIGH (ref <7.0)

## 2018-12-28 MED ORDER — OXYCODONE HCL 30 MG PO TABS: 30.0000 mg | ORAL_TABLET | Freq: Four times a day (QID) | ORAL | 0 refills | Status: DC | PRN

## 2018-12-28 MED ORDER — BUSPIRONE HCL 10 MG PO TABS: 10.0000 mg | ORAL_TABLET | Freq: Three times a day (TID) | ORAL | 5 refills | Status: DC | PRN

## 2018-12-28 MED ORDER — OXYCODONE HCL 30 MG PO TABS: 30.0000 mg | ORAL_TABLET | Freq: Four times a day (QID) | ORAL | 0 refills | Status: DC

## 2018-12-28 MED ORDER — GABAPENTIN 600 MG PO TABS: 600.0000 mg | ORAL_TABLET | Freq: Three times a day (TID) | ORAL | 5 refills | Status: DC

## 2018-12-29 LAB — CMP14+EGFR
ALT: 25 IU/L (ref 0–32)
AST: 24 IU/L (ref 0–40)
Albumin/Globulin Ratio: 1.9 (ref 1.2–2.2)
Albumin: 4.7 g/dL (ref 3.8–4.8)
Alkaline Phosphatase: 60 IU/L (ref 39–117)
BUN/Creatinine Ratio: 13 (ref 12–28)
BUN: 9 mg/dL (ref 8–27)
Bilirubin Total: 0.3 mg/dL (ref 0.0–1.2)
CO2: 24 mmol/L (ref 20–29)
Calcium: 10.1 mg/dL (ref 8.7–10.3)
Chloride: 98 mmol/L (ref 96–106)
Creatinine, Ser: 0.67 mg/dL (ref 0.57–1.00)
GFR calc Af Amer: 104 mL/min/{1.73_m2} (ref >59)
GFR calc non Af Amer: 91 mL/min/{1.73_m2} (ref >59)
Globulin, Total: 2.5 g/dL (ref 1.5–4.5)
Glucose: 191 mg/dL — ABNORMAL HIGH (ref 65–99)
Potassium: 4.7 mmol/L (ref 3.5–5.2)
Sodium: 140 mmol/L (ref 134–144)
Total Protein: 7.2 g/dL (ref 6.0–8.5)

## 2018-12-29 LAB — CBC WITH DIFFERENTIAL/PLATELET
Basophils Absolute: 0.1 10*3/uL (ref 0.0–0.2)
Basos: 1 %
EOS (ABSOLUTE): 0.2 10*3/uL (ref 0.0–0.4)
Eos: 2 %
Hematocrit: 42.8 % (ref 34.0–46.6)
Hemoglobin: 15.1 g/dL (ref 11.1–15.9)
Immature Grans (Abs): 0 10*3/uL (ref 0.0–0.1)
Immature Granulocytes: 0 %
Lymphocytes Absolute: 1.8 10*3/uL (ref 0.7–3.1)
Lymphs: 21 %
MCH: 32.8 pg (ref 26.6–33.0)
MCHC: 35.3 g/dL (ref 31.5–35.7)
MCV: 93 fL (ref 79–97)
Monocytes Absolute: 0.8 10*3/uL (ref 0.1–0.9)
Monocytes: 10 %
Neutrophils Absolute: 5.5 10*3/uL (ref 1.4–7.0)
Neutrophils: 66 %
Platelets: 310 10*3/uL (ref 150–450)
RBC: 4.6 x10E6/uL (ref 3.77–5.28)
RDW: 12.4 % (ref 11.7–15.4)
WBC: 8.4 10*3/uL (ref 3.4–10.8)

## 2018-12-29 LAB — MICROALBUMIN / CREATININE URINE RATIO
Creatinine, Urine: 116.6 mg/dL
Microalb/Creat Ratio: 336 mg/g creat — ABNORMAL HIGH (ref 0–29)
Microalbumin, Urine: 391.4 ug/mL

## 2019-01-01 NOTE — Progress Notes (Signed)
Cancelled the second and third script for the pain med and will go month by month to get the refills sent.

## 2019-01-01 NOTE — Progress Notes (Signed)
Patient previously referred to pain clinic for high dose opioid needs/ recent violation of controlled substance contract.  I have reviewed recommendations with PCP with regards to ongoing medication refills.  Would strongly suggest patient arrange treatment at [facility] given aforementioned findings.  At most, would readdress medication on a month by month basis rather than 3 month rx for this controlled substance.  Jenilee Franey M. Nadine Counts, DO [facility]

## 2019-01-02 ENCOUNTER — Ambulatory Visit: Payer: Medicare PPO | Admitting: Physician Assistant

## 2019-02-24 DIAGNOSIS — R739 Hyperglycemia, unspecified: Secondary | ICD-10-CM | POA: Diagnosis not present

## 2019-02-25 DIAGNOSIS — E1310 Other specified diabetes mellitus with ketoacidosis without coma: Secondary | ICD-10-CM | POA: Diagnosis not present

## 2019-02-26 DIAGNOSIS — E1310 Other specified diabetes mellitus with ketoacidosis without coma: Secondary | ICD-10-CM | POA: Diagnosis not present

## 2019-02-27 ENCOUNTER — Telehealth: Payer: Self-pay | Admitting: Physician Assistant

## 2019-02-27 DIAGNOSIS — E1310 Other specified diabetes mellitus with ketoacidosis without coma: Secondary | ICD-10-CM | POA: Diagnosis not present

## 2019-02-27 NOTE — Telephone Encounter (Signed)
Med List faxed to Dr. Katherina Mires @ [phone]

## 2019-02-28 DIAGNOSIS — E1310 Other specified diabetes mellitus with ketoacidosis without coma: Secondary | ICD-10-CM | POA: Diagnosis not present

## 2019-03-01 DIAGNOSIS — A419 Sepsis, unspecified organism: Secondary | ICD-10-CM | POA: Diagnosis not present

## 2019-03-01 DIAGNOSIS — R42 Dizziness and giddiness: Secondary | ICD-10-CM | POA: Diagnosis not present

## 2019-03-01 DIAGNOSIS — K219 Gastro-esophageal reflux disease without esophagitis: Secondary | ICD-10-CM | POA: Diagnosis not present

## 2019-03-01 DIAGNOSIS — E1310 Other specified diabetes mellitus with ketoacidosis without coma: Secondary | ICD-10-CM | POA: Diagnosis not present

## 2019-03-01 DIAGNOSIS — I6340 Cerebral infarction due to embolism of unspecified cerebral artery: Secondary | ICD-10-CM | POA: Diagnosis not present

## 2019-03-01 DIAGNOSIS — M6281 Muscle weakness (generalized): Secondary | ICD-10-CM | POA: Diagnosis not present

## 2019-03-01 DIAGNOSIS — R2681 Unsteadiness on feet: Secondary | ICD-10-CM | POA: Diagnosis not present

## 2019-03-01 DIAGNOSIS — T402X5A Adverse effect of other opioids, initial encounter: Secondary | ICD-10-CM | POA: Diagnosis not present

## 2019-03-01 DIAGNOSIS — I4891 Unspecified atrial fibrillation: Secondary | ICD-10-CM | POA: Diagnosis not present

## 2019-03-01 DIAGNOSIS — I69319 Unspecified symptoms and signs involving cognitive functions following cerebral infarction: Secondary | ICD-10-CM | POA: Diagnosis not present

## 2019-03-01 DIAGNOSIS — Z03818 Encounter for observation for suspected exposure to other biological agents ruled out: Secondary | ICD-10-CM | POA: Diagnosis not present

## 2019-03-01 DIAGNOSIS — E1140 Type 2 diabetes mellitus with diabetic neuropathy, unspecified: Secondary | ICD-10-CM | POA: Diagnosis not present

## 2019-03-01 DIAGNOSIS — R41841 Cognitive communication deficit: Secondary | ICD-10-CM | POA: Diagnosis not present

## 2019-03-01 DIAGNOSIS — I6930 Unspecified sequelae of cerebral infarction: Secondary | ICD-10-CM | POA: Diagnosis not present

## 2019-03-01 DIAGNOSIS — F339 Major depressive disorder, recurrent, unspecified: Secondary | ICD-10-CM | POA: Diagnosis not present

## 2019-03-01 DIAGNOSIS — E1110 Type 2 diabetes mellitus with ketoacidosis without coma: Secondary | ICD-10-CM | POA: Diagnosis not present

## 2019-03-01 DIAGNOSIS — G92 Toxic encephalopathy: Secondary | ICD-10-CM | POA: Diagnosis not present

## 2019-03-01 DIAGNOSIS — Z8673 Personal history of transient ischemic attack (TIA), and cerebral infarction without residual deficits: Secondary | ICD-10-CM | POA: Diagnosis not present

## 2019-03-01 DIAGNOSIS — F419 Anxiety disorder, unspecified: Secondary | ICD-10-CM | POA: Diagnosis not present

## 2019-03-01 DIAGNOSIS — I10 Essential (primary) hypertension: Secondary | ICD-10-CM | POA: Diagnosis not present

## 2019-03-01 DIAGNOSIS — Z720 Tobacco use: Secondary | ICD-10-CM | POA: Diagnosis not present

## 2019-03-01 DIAGNOSIS — R7989 Other specified abnormal findings of blood chemistry: Secondary | ICD-10-CM | POA: Diagnosis not present

## 2019-03-01 DIAGNOSIS — E785 Hyperlipidemia, unspecified: Secondary | ICD-10-CM | POA: Diagnosis not present

## 2019-03-01 DIAGNOSIS — N3090 Cystitis, unspecified without hematuria: Secondary | ICD-10-CM | POA: Diagnosis not present

## 2019-03-04 DIAGNOSIS — T402X5A Adverse effect of other opioids, initial encounter: Secondary | ICD-10-CM | POA: Diagnosis not present

## 2019-03-04 DIAGNOSIS — Z03818 Encounter for observation for suspected exposure to other biological agents ruled out: Secondary | ICD-10-CM | POA: Diagnosis not present

## 2019-03-04 DIAGNOSIS — G92 Toxic encephalopathy: Secondary | ICD-10-CM | POA: Diagnosis not present

## 2019-03-04 DIAGNOSIS — A419 Sepsis, unspecified organism: Secondary | ICD-10-CM | POA: Diagnosis not present

## 2019-03-09 DIAGNOSIS — N179 Acute kidney failure, unspecified: Secondary | ICD-10-CM | POA: Diagnosis not present

## 2019-03-09 DIAGNOSIS — J9621 Acute and chronic respiratory failure with hypoxia: Secondary | ICD-10-CM | POA: Diagnosis not present

## 2019-03-09 DIAGNOSIS — J690 Pneumonitis due to inhalation of food and vomit: Secondary | ICD-10-CM | POA: Diagnosis not present

## 2019-03-09 DIAGNOSIS — G9341 Metabolic encephalopathy: Secondary | ICD-10-CM | POA: Diagnosis not present

## 2019-03-12 ENCOUNTER — Ambulatory Visit: Payer: Medicare PPO | Admitting: Physician Assistant

## 2019-03-14 DIAGNOSIS — R739 Hyperglycemia, unspecified: Secondary | ICD-10-CM | POA: Diagnosis not present

## 2019-03-14 DIAGNOSIS — I10 Essential (primary) hypertension: Secondary | ICD-10-CM | POA: Diagnosis not present

## 2019-03-16 DIAGNOSIS — R739 Hyperglycemia, unspecified: Secondary | ICD-10-CM | POA: Diagnosis not present

## 2019-03-21 ENCOUNTER — Telehealth: Payer: Self-pay | Admitting: Physician Assistant

## 2019-03-21 NOTE — Telephone Encounter (Signed)
Caitlin Pennington talked to patient

## 2019-04-06 ENCOUNTER — Telehealth: Payer: Self-pay | Admitting: Physician Assistant

## 2019-04-06 NOTE — Telephone Encounter (Signed)
Patient aware that we will schedule her on Tuesday with Glenard Haring on her remote day for a telephone visit. Patient is needing to be seen for nausea and vomiting. Patient offered appointment for today with on call provider but she only wants to see [facility].

## 2019-04-10 ENCOUNTER — Ambulatory Visit (INDEPENDENT_AMBULATORY_CARE_PROVIDER_SITE_OTHER): Payer: Medicare PPO | Admitting: Physician Assistant

## 2019-04-10 ENCOUNTER — Encounter: Payer: Self-pay | Admitting: Physician Assistant

## 2019-04-10 DIAGNOSIS — E10311 Type 1 diabetes mellitus with unspecified diabetic retinopathy with macular edema: Secondary | ICD-10-CM

## 2019-04-10 DIAGNOSIS — M5090 Cervical disc disorder, unspecified, unspecified cervical region: Secondary | ICD-10-CM

## 2019-04-10 DIAGNOSIS — R110 Nausea: Secondary | ICD-10-CM | POA: Diagnosis not present

## 2019-04-10 DIAGNOSIS — G8311 Monoplegia of lower limb affecting right dominant side: Secondary | ICD-10-CM | POA: Diagnosis not present

## 2019-04-10 DIAGNOSIS — E1039 Type 1 diabetes mellitus with other diabetic ophthalmic complication: Secondary | ICD-10-CM

## 2019-04-10 DIAGNOSIS — M5137 Other intervertebral disc degeneration, lumbosacral region: Secondary | ICD-10-CM

## 2019-04-10 DIAGNOSIS — M2550 Pain in unspecified joint: Secondary | ICD-10-CM

## 2019-04-10 DIAGNOSIS — I6930 Unspecified sequelae of cerebral infarction: Secondary | ICD-10-CM | POA: Diagnosis not present

## 2019-04-10 DIAGNOSIS — M5136 Other intervertebral disc degeneration, lumbar region: Secondary | ICD-10-CM

## 2019-04-10 DIAGNOSIS — K5904 Chronic idiopathic constipation: Secondary | ICD-10-CM | POA: Insufficient documentation

## 2019-04-10 DIAGNOSIS — I639 Cerebral infarction, unspecified: Secondary | ICD-10-CM | POA: Insufficient documentation

## 2019-04-10 DIAGNOSIS — I10 Essential (primary) hypertension: Secondary | ICD-10-CM

## 2019-04-10 MED ORDER — LINACLOTIDE 72 MCG PO CAPS: 72.0000 ug | ORAL_CAPSULE | Freq: Every day | ORAL | 5 refills | Status: AC

## 2019-04-10 MED ORDER — OXYCODONE HCL 20 MG PO TABS: 20.0000 mg | ORAL_TABLET | Freq: Four times a day (QID) | ORAL | 0 refills | Status: DC | PRN

## 2019-04-10 MED ORDER — GABAPENTIN 600 MG PO TABS: 600.0000 mg | ORAL_TABLET | Freq: Three times a day (TID) | ORAL | 5 refills | Status: AC

## 2019-04-10 MED ORDER — NOVOLOG FLEXPEN 100 UNIT/ML ~~LOC~~ SOPN: PEN_INJECTOR | SUBCUTANEOUS | 5 refills | Status: AC

## 2019-04-10 MED ORDER — OXYCODONE HCL 20 MG PO TABS: 20.0000 mg | ORAL_TABLET | Freq: Four times a day (QID) | ORAL | 0 refills | Status: AC

## 2019-04-10 MED ORDER — PROMETHAZINE HCL 25 MG PO TABS: 25.0000 mg | ORAL_TABLET | Freq: Four times a day (QID) | ORAL | 2 refills | Status: DC | PRN

## 2019-04-10 MED ORDER — INSULIN DETEMIR 100 UNIT/ML ~~LOC~~ SOLN: 16.0000 [IU] | Freq: Every day | SUBCUTANEOUS | 11 refills | Status: AC

## 2019-04-12 ENCOUNTER — Other Ambulatory Visit: Payer: Self-pay | Admitting: Physician Assistant

## 2019-04-12 MED ORDER — CETIRIZINE HCL 10 MG PO TABS: ORAL_TABLET | ORAL | 5 refills | Status: AC

## 2019-04-12 MED ORDER — ATORVASTATIN CALCIUM 10 MG PO TABS: 10.0000 mg | ORAL_TABLET | Freq: Every day | ORAL | 11 refills | Status: AC

## 2019-04-12 MED ORDER — HYDROCHLOROTHIAZIDE 25 MG PO TABS: 25.0000 mg | ORAL_TABLET | Freq: Every day | ORAL | 11 refills | Status: AC

## 2019-04-12 MED ORDER — FENOFIBRATE MICRONIZED 134 MG PO CAPS: 134.0000 mg | ORAL_CAPSULE | Freq: Every morning | ORAL | 11 refills | Status: AC

## 2019-04-12 MED ORDER — PANTOPRAZOLE SODIUM 40 MG PO TBEC: 40.0000 mg | DELAYED_RELEASE_TABLET | Freq: Every day | ORAL | 11 refills | Status: AC

## 2019-04-12 MED ORDER — LISINOPRIL 20 MG PO TABS: 20.0000 mg | ORAL_TABLET | Freq: Every day | ORAL | 3 refills | Status: AC

## 2019-04-12 MED ORDER — MELOXICAM 7.5 MG PO TABS: 7.5000 mg | ORAL_TABLET | Freq: Every day | ORAL | 3 refills | Status: AC

## 2019-04-12 MED ORDER — ROPINIROLE HCL 0.5 MG PO TABS: 0.5000 mg | ORAL_TABLET | Freq: Every day | ORAL | 5 refills | Status: AC

## 2019-04-12 MED ORDER — VITAMIN D (ERGOCALCIFEROL) 1.25 MG (50000 UNIT) PO CAPS: 50000.0000 [IU] | ORAL_CAPSULE | ORAL | 11 refills | Status: AC

## 2019-04-12 MED ORDER — VICTOZA 18 MG/3ML ~~LOC~~ SOPN: PEN_INJECTOR | SUBCUTANEOUS | 11 refills | Status: AC

## 2019-04-12 NOTE — Progress Notes (Signed)
Telephone visit  Subjective: ZO:XWRUEAVCC:chronic medical conditions and medications PCP: Remus LofflerJones, Taylorann Tkach S, PA-C WUJ:Caitlin Pennington:Kala Kenn[place] CenterHines is a 68 y.o. female calls for telephone consult today. Patient provides verbal consent for consult held via phone.  Patient is identified with 2 separate identifiers.  At this time the entire area is on COVID-19 social distancing and stay home orders are in place.  Patient is of higher risk and therefore we are performing this by a virtual method.  Location of patient: Home Location of provider: HOME Others present for call: Husband  This patient is having a significant issue the states she is status post a CVA she was taking care of through the hospital to [facility] [place].  She even had to go to a nursing home.  Her experience was quite bad.  Her her husband was not able to come in.  She has continued with continued nausea.  The primary resume residual effects from her stroke or right foot paralysis, and loss of sensation.  Also her left face is feeling numbness.  And her left toes are numb.  She has continued with chronic constipation and is been made worse by her not being able to eat and move around.  Her husband is helping and taking care of things.  At this time her sugars have not been being very well controlled.  They currently have her using Levemir 15 units once daily and 15 units at each meal.  Including 1 at bedtime we are going to do some adjustments to have her increase her Levemir by 1 unit a day until her fasting blood sugars around 150.  We will also have her discontinue the nighttime NovoLog.  Unless she has a large snack.  She will let us know if anything does not get better.  She will continue the Victoza.  All of [facility].  And they will let us know if anything needs to be changed.   ROS: Per HPI  Allergies  Allergen Reactions  . Demerol [Meperidine] Swelling  . Stadol [Butorphanol] Swelling   Past  Medical History:  Diagnosis Date  . Anxiety   . Back pain   . Diabetes mellitus without complication (HCC)   . Hyperlipidemia   . Hypertension   . Neuropathy     Current Outpatient Medications:  .  atorvastatin (LIPITOR) 10 MG tablet, Take 1 tablet (10 mg total) by mouth daily., Disp: 30 tablet, Rfl: 11 .  busPIRone (BUSPAR) 10 MG tablet, Take 1 tablet (10 mg total) by mouth 3 (three) times daily as needed., Disp: 60 tablet, Rfl: 5 .  cetirizine (ZYRTEC) 10 MG tablet, TAKE 1 TABLET BY MOUTH AS NEEDED ONCE A DAY, Disp: 30 tablet, Rfl: 5 .  clobetasol cream (TEMOVATE) 0.05 %, Apply 1 application topically 2 (two) times daily., Disp: 30 g, Rfl: 0 .  DULoxetine (CYMBALTA) 30 MG capsule, Take 1-2 capsules (30-60 mg total) by mouth daily. Take one daily 1 week, then go up to 60 mg daily (Patient not taking: Reported on 12/28/2018), Disp: 60 capsule, Rfl: 3 .  fenofibrate micronized (LOFIBRA) 134 MG capsule, Take 1 capsule (134 mg total) by mouth every morning., Disp: 30 capsule, Rfl: 11 .  fluconazole (DIFLUCAN) 150 MG tablet, Take 1 tablet (150 mg total) by mouth daily., Disp: 10 tablet, Rfl: 6 .  gabapentin (NEURONTIN) 600 MG tablet, Take 1 tablet (600 mg total) by mouth 3 (three) times daily., Disp: 90 tablet, Rfl: 5 .  GLOBAL EASE INJECT PEN NEEDLES 32G X 4 MM MISC, USE WITH INSULIN PEN UP TO FIVE TIMES DAILY, Disp: 100 each, Rfl: 2 .  hydrochlorothiazide (HYDRODIURIL) 25 MG tablet, Take 1 tablet (25 mg total) by mouth daily., Disp: 30 tablet, Rfl: 11 .  insulin aspart (NOVOLOG FLEXPEN) 100 UNIT/ML FlexPen, INJECT 10-20 UNITS UNDER SKIN BEFORE MEALS, Disp: 15 mL, Rfl: 5 .  insulin detemir (LEVEMIR) 100 UNIT/ML injection, Inject 0.16-0.6 mLs (16-60 Units total) into the skin daily., Disp: 30 mL, Rfl: 11 .  linaclotide (LINZESS) 72 MCG capsule, Take 1 capsule (72 mcg total) by mouth daily before breakfast., Disp: 30 capsule, Rfl: 5 .  liraglutide (VICTOZA) 18 MG/3ML SOPN, INJECT 0.3 MLS (1.8 MG  TOTAL) INTO THE SKIN DAILY., Disp: 9 mL, Rfl: 11 .  lisinopril (ZESTRIL) 20 MG tablet, Take 1 tablet (20 mg total) by mouth daily., Disp: 90 tablet, Rfl: 3 .  meloxicam (MOBIC) 7.5 MG tablet, Take 1 tablet (7.5 mg total) by mouth daily., Disp: 90 tablet, Rfl: 3 .  metaxalone (SKELAXIN) 800 MG tablet, Take 1 tablet (800 mg total) by mouth 3 (three) times daily., Disp: 90 tablet, Rfl: 5 .  Oxycodone HCl 20 MG TABS, Take 1 tablet (20 mg total) by mouth 4 (four) times daily., Disp: 120 tablet, Rfl: 0 .  Oxycodone HCl 20 MG TABS, Take 1 tablet (20 mg total) by mouth 4 (four) times daily as needed (pain)., Disp: 120 tablet, Rfl: 0 .  Oxycodone HCl 20 MG TABS, Take 1 tablet (20 mg total) by mouth 4 (four) times daily as needed (pain)., Disp: 120 tablet, Rfl: 0 .  pantoprazole (PROTONIX) 40 MG tablet, Take 1 tablet (40 mg total) by mouth daily., Disp: 30 tablet, Rfl: 11 .  promethazine (PHENERGAN) 25 MG tablet, Take 1 tablet (25 mg total) by mouth every 6 (six) hours as needed for nausea or vomiting., Disp: 60 tablet, Rfl: 2 .  rOPINIRole (REQUIP) 0.5 MG tablet, Take 1-2 tablets (0.5-1 mg total) by mouth at bedtime., Disp: 60 tablet, Rfl: 5 .  Vitamin D, Ergocalciferol, (DRISDOL) 1.25 MG (50000 UT) CAPS capsule, Take 1 capsule (50,000 Units total) by mouth once a week., Disp: 30 capsule, Rfl: 11  Assessment/ Plan: 68 y.o. female   1. Nausea - promethazine (PHENERGAN) 25 MG tablet; Take 1 tablet (25 mg total) by mouth every 6 (six) hours as needed for nausea or vomiting.  Dispense: 60 tablet; Refill: 2  2. Cerebrovascular accident (CVA), unspecified mechanism (HCC) - atorvastatin (LIPITOR) 10 MG tablet; Take 1 tablet (10 mg total) by mouth daily.  Dispense: 30 tablet; Refill: 11  3. Paralysis of right lower extremity (HCC)  4. 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  Dispense: 30 mL; Refill: 11 - atorvastatin (LIPITOR) 10 MG tablet; Take 1 tablet (10 mg total) by mouth daily.  Dispense: 30 tablet; Refill: 11 - fenofibrate micronized (LOFIBRA) 134 MG capsule; Take 1 capsule (134 mg total) by mouth every morning.  Dispense: 30 capsule; Refill: 11  5. 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  Dispense: 9 mL; Refill: 11  6. Cervical  disc disease - gabapentin (NEURONTIN) 600 MG tablet; Take 1 tablet (600 mg total) by mouth 3 (three) times daily.  Dispense: 90 tablet; Refill: 5  7. Degeneration of lumbar or lumbosacral intervertebral disc - gabapentin (NEURONTIN) 600 MG tablet; Take 1 tablet (600 mg total) by mouth 3 (three) times daily.  Dispense: 90 tablet; Refill: 5  8. Chronic idiopathic constipation - linaclotide (LINZESS) 72 MCG capsule; Take 1 capsule (72 mcg total) by mouth daily before breakfast.  Dispense: 30 capsule; Refill: 5  9. Pain in joint involving multiple sites  10. DDD (degenerative disc disease), lumbar - gabapentin (NEURONTIN) 600 MG tablet; Take 1 tablet (600 mg total) by mouth 3 (three) times daily.  Dispense: 90 tablet; Refill: 5 - Oxycodone HCl 20 MG TABS; Take 1 tablet (20 mg total) by mouth 4 (four) times daily.  Dispense: 120 tablet; Refill: 0 - Oxycodone HCl 20 MG TABS; Take 1 tablet (20 mg total) by mouth 4 (four) times daily as needed (pain).  Dispense: 120 tablet; Refill: 0 - Oxycodone HCl 20 MG TABS; Take 1 tablet (20 mg total) by mouth 4 (four) times daily as needed (pain).  Dispense: 120 tablet; Refill: 0 - meloxicam (MOBIC) 7.5 MG tablet; Take 1 tablet (7.5 mg total) by mouth daily.  Dispense: 90 tablet; Refill: 3  11. Essential  hypertension - hydrochlorothiazide (HYDRODIURIL) 25 MG tablet; Take 1 tablet (25 mg total) by mouth daily.  Dispense: 30 tablet; Refill: 11 - lisinopril (ZESTRIL) 20 MG tablet; Take 1 tablet (20 mg total) by mouth daily.  Dispense: 90 tablet; Refill: 3   Return in about 6 weeks (around 05/22/2019).  Continue all other maintenance medications as listed above.  Start time: 11:34 AM End time: 11:53 AM  Meds ordered this encounter  Medications  . promethazine (PHENERGAN) 25 MG tablet    Sig: Take 1 tablet (25 mg total) by mouth every 6 (six) hours as needed for nausea or vomiting.    Dispense:  60 tablet    Refill:  2    Order Specific Question:   Supervising Provider    Answer:   Janora Norlander [[id number]]  . insulin aspart (NOVOLOG FLEXPEN) 100 UNIT/ML FlexPen    Sig: INJECT 10-20 UNITS UNDER SKIN BEFORE MEALS    Dispense:  15 mL    Refill:  5    This prescription was filled on 07/27/2018. Any refills authorized will be placed on file.    Order Specific Question:   Supervising Provider    Answer:   Janora Norlander [[id number]]  . gabapentin (NEURONTIN) 600 MG tablet    Sig: Take 1 tablet (600 mg total) by mouth 3 (three) times daily.    Dispense:  90 tablet    Refill:  5    Order Specific Question:   Supervising Provider    Answer:   Janora Norlander [[id number]]  . linaclotide (LINZESS) 72 MCG capsule    Sig: Take 1 capsule (72 mcg total) by mouth daily before breakfast.    Dispense:  30 capsule    Refill:  5    Order Specific Question:   Supervising Provider    Answer:   Janora Norlander [[id number]]  . insulin detemir (LEVEMIR) 100 UNIT/ML injection    Sig: Inject 0.16-0.6 mLs (16-60 Units total) into the skin daily.    Dispense:  30 mL    Refill:  11    Order Specific Question:   Supervising Provider    Answer:  Raliegh Ip [[id number]]  . Oxycodone HCl 20 MG TABS    Sig: Take 1 tablet (20 mg total) by mouth 4 (four) times daily.    Dispense:  120 tablet     Refill:  0    Order Specific Question:   Supervising Provider    Answer:   Raliegh Ip [[id number]]  . Oxycodone HCl 20 MG TABS    Sig: Take 1 tablet (20 mg total) by mouth 4 (four) times daily as needed (pain).    Dispense:  120 tablet    Refill:  0    Fill 30 days from original script date    Order Specific Question:   Supervising Provider    Answer:   Raliegh Ip [[id number]]  . Oxycodone HCl 20 MG TABS    Sig: Take 1 tablet (20 mg total) by mouth 4 (four) times daily as needed (pain).    Dispense:  120 tablet    Refill:  0    Fill 60 days from original script date    Order Specific Question:   Supervising Provider    Answer:   Raliegh Ip [[id number]]  . atorvastatin (LIPITOR) 10 MG tablet    Sig: Take 1 tablet (10 mg total) by mouth daily.    Dispense:  30 tablet    Refill:  11    Order Specific Question:   Supervising Provider    Answer:   Raliegh Ip [[id number]]  . cetirizine (ZYRTEC) 10 MG tablet    Sig: TAKE 1 TABLET BY MOUTH AS NEEDED ONCE A DAY    Dispense:  30 tablet    Refill:  5    This prescription was filled on 02/01/2017. Any refills authorized will be placed on file.    Order Specific Question:   Supervising Provider    Answer:   Raliegh Ip [[id number]]  . fenofibrate micronized (LOFIBRA) 134 MG capsule    Sig: Take 1 capsule (134 mg total) by mouth every morning.    Dispense:  30 capsule    Refill:  11    This prescription was filled on 09/12/2017. Any refills authorized will be placed on file.    Order Specific Question:   Supervising Provider    Answer:   Raliegh Ip [[id number]]  . hydrochlorothiazide (HYDRODIURIL) 25 MG tablet    Sig: Take 1 tablet (25 mg total) by mouth daily.    Dispense:  30 tablet    Refill:  11    This prescription was filled on 03/26/2017. Any refills authorized will be placed on file.    Order Specific Question:   Supervising Provider    Answer:   Raliegh Ip [[id number]]  . liraglutide  (VICTOZA) 18 MG/3ML SOPN    Sig: INJECT 0.3 MLS (1.8 MG TOTAL) INTO THE SKIN DAILY.    Dispense:  9 mL    Refill:  11    This prescription was filled on 06/30/2018. Any refills authorized will be placed on file.    Order Specific Question:   Supervising Provider    Answer:   Raliegh Ip [[id number]]  . lisinopril (ZESTRIL) 20 MG tablet    Sig: Take 1 tablet (20 mg total) by mouth daily.    Dispense:  90 tablet    Refill:  3    Order Specific Question:   Supervising Provider    Answer:   Raliegh Ip [[id number]]  . meloxicam (MOBIC) 7.5 MG tablet  Sig: Take 1 tablet (7.5 mg total) by mouth daily.    Dispense:  90 tablet    Refill:  3    Order Specific Question:   Supervising Provider    Answer:   Raliegh IpGOTTSCHALK, ASHLY M [[id number]]  . pantoprazole (PROTONIX) 40 MG tablet    Sig: Take 1 tablet (40 mg total) by mouth daily.    Dispense:  30 tablet    Refill:  11    Order Specific Question:   Supervising Provider    Answer:   Raliegh IpGOTTSCHALK, ASHLY M [[id number]]  . rOPINIRole (REQUIP) 0.5 MG tablet    Sig: Take 1-2 tablets (0.5-1 mg total) by mouth at bedtime.    Dispense:  60 tablet    Refill:  5    Order Specific Question:   Supervising Provider    Answer:   Raliegh IpGOTTSCHALK, ASHLY M [[id number]]  . Vitamin D, Ergocalciferol, (DRISDOL) 1.25 MG (50000 UT) CAPS capsule    Sig: Take 1 capsule (50,000 Units total) by mouth once a week.    Dispense:  30 capsule    Refill:  11    Order Specific Question:   Supervising Provider    Answer:   Raliegh IpGOTTSCHALK, ASHLY M [[id number]]    Prudy FeelerAngel Brayley Mackowiak PA-C [facility] [phone]

## 2019-04-13 ENCOUNTER — Other Ambulatory Visit: Payer: Self-pay | Admitting: Physician Assistant

## 2019-04-13 MED ORDER — AMLODIPINE BESYLATE 5 MG PO TABS: 5.0000 mg | ORAL_TABLET | Freq: Every day | ORAL | 3 refills | Status: AC

## 2019-04-18 ENCOUNTER — Other Ambulatory Visit: Payer: Self-pay

## 2019-04-19 ENCOUNTER — Encounter: Payer: Self-pay | Admitting: Physician Assistant

## 2019-04-19 ENCOUNTER — Ambulatory Visit (INDEPENDENT_AMBULATORY_CARE_PROVIDER_SITE_OTHER): Payer: Medicare PPO | Admitting: Physician Assistant

## 2019-04-19 DIAGNOSIS — E860 Dehydration: Secondary | ICD-10-CM

## 2019-04-19 DIAGNOSIS — R110 Nausea: Secondary | ICD-10-CM | POA: Diagnosis not present

## 2019-04-19 DIAGNOSIS — R1114 Bilious vomiting: Secondary | ICD-10-CM | POA: Diagnosis not present

## 2019-04-22 ENCOUNTER — Encounter: Payer: Self-pay | Admitting: Physician Assistant

## 2019-04-22 NOTE — Progress Notes (Signed)
Telephone visit  Subjective: CC: Nausea, vomiting, weakness PCP: Terald Sleeper, PA-C Caitlin Pennington is a 68 y.o. female calls for telephone consult today. Patient provides verbal consent for consult held via phone.  Patient is identified with 2 separate identifiers.  At this time the entire area is on COVID-19 social distancing and stay home orders are in place.  Patient is of higher risk and therefore we are performing this by a virtual method.  Location of patient: Home Location of provider: [facility] Others present for call: None  Patient is having a phone visit due to her symptoms of nausea vomiting has been very persistent.  She had gotten out of the hospital and rehabilitation for stroke that she experienced.  However she has continued quite weak.  She has not eaten well in the past 3 days.  She is a insulin-dependent diabetic.  She sounded extremely weak on the telephone.  I encouraged her to get to the hospital to have testing and treatment with IV fluids and anything else that they may find.   ROS: Per HPI  Allergies  Allergen Reactions  . Demerol [Meperidine] Swelling  . Stadol [Butorphanol] Swelling   Past Medical History:  Diagnosis Date  . Anxiety   . Back pain   . Diabetes mellitus without complication ([place])   . Hyperlipidemia   . Hypertension   . Neuropathy     Current Outpatient Medications:  .  amLODipine (NORVASC) 5 MG tablet, Take 1 tablet (5 mg total) by mouth daily. for high blood pressure, Disp: 90 tablet, Rfl: 3 .  atorvastatin (LIPITOR) 10 MG tablet, Take 1 tablet (10 mg total) by mouth daily., Disp: 30 tablet, Rfl: 11 .  busPIRone (BUSPAR) 10 MG tablet, Take 1 tablet (10 mg total) by mouth 3 (three) times daily as needed., Disp: 60 tablet, Rfl: 5 .  cetirizine (ZYRTEC) 10 MG tablet, TAKE 1 TABLET BY MOUTH AS NEEDED ONCE A DAY, Disp: 30 tablet, Rfl: 5 .  clobetasol cream (TEMOVATE) 3.81 %, Apply 1 application topically 2 (two) times daily., Disp: 30  g, Rfl: 0 .  clopidogrel (PLAVIX) 75 MG tablet, TAKE 1 TABLET DAILY., Disp: 90 tablet, Rfl: 3 .  DULoxetine (CYMBALTA) 30 MG capsule, Take 1-2 capsules (30-60 mg total) by mouth daily. Take one daily 1 week, then go up to 60 mg daily (Patient not taking: Reported on 12/28/2018), Disp: 60 capsule, Rfl: 3 .  fenofibrate micronized (LOFIBRA) 134 MG capsule, Take 1 capsule (134 mg total) by mouth every morning., Disp: 30 capsule, Rfl: 11 .  fluconazole (DIFLUCAN) 150 MG tablet, Take 1 tablet (150 mg total) by mouth daily., Disp: 10 tablet, Rfl: 6 .  gabapentin (NEURONTIN) 600 MG tablet, Take 1 tablet (600 mg total) by mouth 3 (three) times daily., Disp: 90 tablet, Rfl: 5 .  GLOBAL EASE INJECT PEN NEEDLES 32G X 4 MM MISC, USE WITH INSULIN PEN UP TO FIVE TIMES DAILY, Disp: 100 each, Rfl: 2 .  hydrochlorothiazide (HYDRODIURIL) 25 MG tablet, Take 1 tablet (25 mg total) by mouth daily., Disp: 30 tablet, Rfl: 11 .  insulin aspart (NOVOLOG FLEXPEN) 100 UNIT/ML FlexPen, INJECT 10-20 UNITS UNDER SKIN BEFORE MEALS, Disp: 15 mL, Rfl: 5 .  insulin detemir (LEVEMIR) 100 UNIT/ML injection, Inject 0.16-0.6 mLs (16-60 Units total) into the skin daily., Disp: 30 mL, Rfl: 11 .  linaclotide (LINZESS) 72 MCG capsule, Take 1 capsule (72 mcg total) by mouth daily before breakfast., Disp: 30 capsule, Rfl: 5 .  liraglutide (VICTOZA) 18 MG/3ML SOPN, INJECT 0.3 MLS (1.8 MG TOTAL) INTO THE SKIN DAILY., Disp: 9 mL, Rfl: 11 .  lisinopril (ZESTRIL) 20 MG tablet, Take 1 tablet (20 mg total) by mouth daily., Disp: 90 tablet, Rfl: 3 .  meloxicam (MOBIC) 7.5 MG tablet, Take 1 tablet (7.5 mg total) by mouth daily., Disp: 90 tablet, Rfl: 3 .  metaxalone (SKELAXIN) 800 MG tablet, Take 1 tablet (800 mg total) by mouth 3 (three) times daily., Disp: 90 tablet, Rfl: 5 .  Oxycodone HCl 20 MG TABS, Take 1 tablet (20 mg total) by mouth 4 (four) times daily., Disp: 120 tablet, Rfl: 0 .  Oxycodone HCl 20 MG TABS, Take 1 tablet (20 mg total) by mouth  4 (four) times daily as needed (pain)., Disp: 120 tablet, Rfl: 0 .  Oxycodone HCl 20 MG TABS, Take 1 tablet (20 mg total) by mouth 4 (four) times daily as needed (pain)., Disp: 120 tablet, Rfl: 0 .  pantoprazole (PROTONIX) 40 MG tablet, Take 1 tablet (40 mg total) by mouth daily., Disp: 30 tablet, Rfl: 11 .  promethazine (PHENERGAN) 25 MG tablet, Take 1 tablet (25 mg total) by mouth every 6 (six) hours as needed for nausea or vomiting., Disp: 60 tablet, Rfl: 2 .  rOPINIRole (REQUIP) 0.5 MG tablet, Take 1-2 tablets (0.5-1 mg total) by mouth at bedtime., Disp: 60 tablet, Rfl: 5 .  Vitamin D, Ergocalciferol, (DRISDOL) 1.25 MG (50000 UT) CAPS capsule, Take 1 capsule (50,000 Units total) by mouth once a week., Disp: 30 capsule, Rfl: 11  Assessment/ Plan: 68 y.o. female   1. Nausea Go to ED  2. Bilious vomiting with nausea Emergency room  3. Dehydration Go to ED   No follow-ups on file.  Continue all other maintenance medications as listed above.  Start time: 10:11 AM End time: 10:18 AM  No orders of the defined types were placed in this encounter.   Prudy FeelerAngel Israel Wunder PA-C [facility] [phone]

## 2019-04-26 ENCOUNTER — Emergency Department (HOSPITAL_COMMUNITY)
Admission: EM | Admit: 2019-04-26 | Discharge: 2019-04-27 | Disposition: A | Discharge status: Home / Self Care | Payer: Medicare PPO | Attending: Emergency Medicine | Admitting: Emergency Medicine

## 2019-04-26 ENCOUNTER — Encounter (HOSPITAL_COMMUNITY): Payer: Self-pay | Admitting: Emergency Medicine

## 2019-04-26 ENCOUNTER — Telehealth: Payer: Self-pay | Admitting: *Deleted

## 2019-04-26 ENCOUNTER — Other Ambulatory Visit: Payer: Self-pay

## 2019-04-26 ENCOUNTER — Emergency Department (HOSPITAL_COMMUNITY): Payer: Medicare PPO

## 2019-04-26 DIAGNOSIS — I10 Essential (primary) hypertension: Secondary | ICD-10-CM | POA: Diagnosis not present

## 2019-04-26 DIAGNOSIS — E109 Type 1 diabetes mellitus without complications: Secondary | ICD-10-CM | POA: Diagnosis not present

## 2019-04-26 DIAGNOSIS — R404 Transient alteration of awareness: Secondary | ICD-10-CM | POA: Insufficient documentation

## 2019-04-26 DIAGNOSIS — R634 Abnormal weight loss: Secondary | ICD-10-CM | POA: Insufficient documentation

## 2019-04-26 DIAGNOSIS — Z8673 Personal history of transient ischemic attack (TIA), and cerebral infarction without residual deficits: Secondary | ICD-10-CM | POA: Insufficient documentation

## 2019-04-26 DIAGNOSIS — R112 Nausea with vomiting, unspecified: Secondary | ICD-10-CM | POA: Insufficient documentation

## 2019-04-26 HISTORY — DX: Cerebral infarction, unspecified: I63.9

## 2019-04-26 LAB — TYPE AND SCREEN
ABO/RH(D): B POS
Antibody Screen: NEGATIVE

## 2019-04-26 LAB — CBC
HCT: 45.6 % (ref 36.0–46.0)
Hemoglobin: 15.5 g/dL — ABNORMAL HIGH (ref 12.0–15.0)
MCH: 30.9 pg (ref 26.0–34.0)
MCHC: 34 g/dL (ref 30.0–36.0)
MCV: 90.8 fL (ref 80.0–100.0)
Platelets: 280 10*3/uL (ref 150–400)
RBC: 5.02 MIL/uL (ref 3.87–5.11)
RDW: 13.5 % (ref 11.5–15.5)
WBC: 8.8 10*3/uL (ref 4.0–10.5)
nRBC: 0 % (ref 0.0–0.2)

## 2019-04-26 LAB — COMPREHENSIVE METABOLIC PANEL
ALT: 29 U/L (ref 0–44)
AST: 30 U/L (ref 15–41)
Albumin: 4.5 g/dL (ref 3.5–5.0)
Alkaline Phosphatase: 63 U/L (ref 38–126)
Anion gap: 12 (ref 5–15)
BUN: 12 mg/dL (ref 8–23)
CO2: 26 mmol/L (ref 22–32)
Calcium: 10.6 mg/dL — ABNORMAL HIGH (ref 8.9–10.3)
Chloride: 97 mmol/L — ABNORMAL LOW (ref 98–111)
Creatinine, Ser: 0.72 mg/dL (ref 0.44–1.00)
GFR calc Af Amer: >60 mL/min (ref >60)
GFR calc non Af Amer: >60 mL/min (ref >60)
Glucose, Bld: 182 mg/dL — ABNORMAL HIGH (ref 70–99)
Potassium: 3.6 mmol/L (ref 3.5–5.1)
Sodium: 135 mmol/L (ref 135–145)
Total Bilirubin: 0.8 mg/dL (ref 0.3–1.2)
Total Protein: 8.2 g/dL — ABNORMAL HIGH (ref 6.5–8.1)

## 2019-04-26 LAB — LIPASE, BLOOD: Lipase: 26 U/L (ref 11–51)

## 2019-04-26 MED ORDER — PROMETHAZINE HCL 12.5 MG PO TABS: 12.5000 mg | ORAL_TABLET | Freq: Four times a day (QID) | ORAL | 0 refills | Status: AC | PRN

## 2019-04-26 MED ORDER — IOHEXOL 300 MG/ML  SOLN
100.0000 mL | Freq: Once | INTRAMUSCULAR | Status: AC | PRN
  Administered 2019-04-27: 100 mL via INTRAVENOUS

## 2019-04-26 MED ORDER — SODIUM CHLORIDE 0.9 % IV BOLUS
500.0000 mL | Freq: Once | INTRAVENOUS | Status: AC
  Administered 2019-04-26: 21:00:00 500 mL via INTRAVENOUS

## 2019-04-26 MED ORDER — SODIUM CHLORIDE 0.9% FLUSH
3.0000 mL | Freq: Once | INTRAVENOUS | Status: AC
  Administered 2019-04-26: 3 mL via INTRAVENOUS

## 2019-04-26 NOTE — ED Provider Notes (Signed)
Emergency Department Provider Note   I have reviewed the triage vital signs and the nursing notes.   HISTORY  Chief Complaint Emesis   HPI Caitlin BentCarol Pennington is a 68 y.o. female with past medical history of stroke and diabetes presents to the emergency department for evaluation of intermittent nausea and vomiting.  Patient states that shortly after her stroke she developed this episodic vomiting.  She has multiple episodes of emesis approximately twice per week.  No clear provoking or modifying factors.  She has had unintentional weight loss.  She states she has been to the [facility] emergency department several times with normal CT abdomen pelvis and discharged on Zofran.  She is been taking this medication with no improvement in symptoms.  Denies headache, fever, chest pain, or shortness of breath. No active nausea. No vision or speech changes.    Past Medical History:  Diagnosis Date   Anxiety    Back pain    Diabetes mellitus without complication (HCC)    Hyperlipidemia    Hypertension    Neuropathy    Stroke [facility](HCC)     Patient Active Problem List   Diagnosis Date Noted   Paralysis of right lower extremity (HCC) 04/10/2019   Cerebrovascular accident (CVA) (HCC) 04/10/2019   Chronic idiopathic constipation 04/10/2019   Fibrocystic breast changes 07/25/2018   Carpal tunnel syndrome of right wrist 05/01/2018   Osteopenia of lumbar spine 05/01/2018   Thenar atrophy, right 03/17/2018   Calcific tendinitis of hand, right 03/17/2018   Vaginal atrophy 09/12/2017   DDD (degenerative disc disease), lumbar 06/14/2017   Low back pain radiating to left leg 06/14/2017   Sciatica of left side 03/23/2017   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 09/30/2016   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 09/30/2016   Pain in joint involving multiple sites 09/30/2016   Generalized anxiety disorder 09/30/2016   Cervical disc disease 05/03/2016   Degeneration of  lumbar or lumbosacral intervertebral disc 05/03/2016   Body mass index 26.0-26.9, adult 05/03/2016   Hyperlipidemia 05/03/2016   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   Depression 05/30/2009   Background diabetic retinopathy (HCC) 05/30/2009   Essential hypertension 05/30/2009    Past Surgical History:  Procedure Laterality Date   ABDOMINAL HYSTERECTOMY     CHOLECYSTECTOMY     TONSILLECTOMY AND ADENOIDECTOMY      Allergies Demerol [meperidine] and Stadol [butorphanol]  Family History  Problem Relation Age of Onset   Congestive Heart Failure Mother    Diabetes Mother    Heart disease Father     Social History Social History   Tobacco Use   Smoking status: Never Smoker   Smokeless tobacco: Never Used  Substance Use Topics   Alcohol use: No   Drug use: No    Review of Systems  Constitutional: No fever/chills Eyes: No visual changes. ENT: No sore throat. Cardiovascular: Denies chest pain. Respiratory: Denies shortness of breath. Gastrointestinal: No abdominal pain. Positive nausea and vomiting.  No diarrhea.  No constipation. Genitourinary: Negative for dysuria. Musculoskeletal: Negative for back pain. Skin: Negative for rash. Neurological: Negative for headaches, focal weakness or numbness.  10-point ROS otherwise negative.  ____________________________________________   PHYSICAL EXAM:  VITAL SIGNS: ED Triage Vitals  Enc Vitals Group     BP 04/26/19 1637 106/74     Pulse Rate 04/26/19 1634 (!) 120     Resp 04/26/19 1634 16     Temp 04/26/19 1634 97.8 F (36.6 C)  Temp Source 04/26/19 1634 Oral     SpO2 04/26/19 1634 98 %     Weight 04/26/19 1635 154 lb (69.9 kg)     Height 04/26/19 1635 5\' 4"  (1.626 m)   Constitutional: Alert and oriented. Well appearing and in no acute distress. Mild confusion at times.  Eyes: Conjunctivae are normal. PERRL.  Head: Atraumatic. Nose: No  congestion/rhinnorhea. Mouth/Throat: Mucous membranes are moist.  Neck: No stridor. Cardiovascular: Tachycardia. Good peripheral circulation. Grossly normal heart sounds.   Respiratory: Normal respiratory effort.  No retractions. Lungs CTAB. Gastrointestinal: Soft and nontender. No distention.  Musculoskeletal: No lower extremity tenderness nor edema. No gross deformities of extremities. Neurologic:  Normal speech and language. No gross focal neurologic deficits are appreciated.  Skin:  Skin is warm, dry and intact. No rash noted.  ____________________________________________   LABS (all labs ordered are listed, but only abnormal results are displayed)  Labs Reviewed  COMPREHENSIVE METABOLIC PANEL - Abnormal; Notable for the following components:      Result Value   Chloride 97 (*)    Glucose, Bld 182 (*)    Calcium 10.6 (*)    Total Protein 8.2 (*)    All other components within normal limits  CBC - Abnormal; Notable for the following components:   Hemoglobin 15.5 (*)    All other components within normal limits  LIPASE, BLOOD  TYPE AND SCREEN   ____________________________________________  EKG   EKG Interpretation  Date/Time:  Thursday April 26 2019 21:23:53 EDT Ventricular Rate:  105 PR Interval:  152 QRS Duration: 88 QT Interval:  511 QTC Calculation: 676 R Axis:   -14 Text Interpretation:  Sinus tachycardia Inferior infarct, old Prolonged QT interval No STEMI  Confirmed by Alona BeneLong, Acel Natzke [phone]([id number]) on 04/26/2019 9:40:05 PM       ____________________________________________  RADIOLOGY  Ct Head Wo Contrast  Result Date: 04/26/2019 CLINICAL DATA:  Altered level of consciousness, emesis for 2 days a week since stroke 4 months prior EXAM: CT HEAD WITHOUT CONTRAST TECHNIQUE: Contiguous axial images were obtained from the base of the skull through the vertex without intravenous contrast. COMPARISON:  CT head 11/28/2009 FINDINGS: Brain: Remote infarcts are present in the  left and right cerebellar hemispheres, not present on CT scan from 2011. No evidence of acute infarction, hemorrhage, hydrocephalus, extra-axial collection or mass lesion/mass effect. Symmetric prominence of the ventricles, cisterns and sulci compatible with parenchymal volume loss. Patchy areas of white matter hypoattenuation are most compatible with chronic microvascular angiopathy. Vascular: Atherosclerotic calcification of the carotid siphons and intradural vertebral arteries. Skull: No calvarial fracture or suspicious osseous lesion. No scalp swelling or hematoma. Sinuses/Orbits: Paranasal sinuses and mastoid air cells are predominantly clear. Orbital structures are unremarkable. Other: None. IMPRESSION: 1. No acute intracranial abnormality. 2. Remote infarcts in the left and right cerebellar hemispheres, not present on CT scan from 2011. 3. Generalized parenchymal volume loss and chronic microvascular ischemic changes. Electronically Signed   By: Kreg ShropshirePrice  DeHay M.D.   On: 04/26/2019 22:39   Ct Abdomen Pelvis W Contrast  Result Date: 04/27/2019 CLINICAL DATA:  Ongoing emesis since stroke 4 months prior EXAM: CT ABDOMEN AND PELVIS WITH CONTRAST TECHNIQUE: Multidetector CT imaging of the abdomen and pelvis was performed using the standard protocol following bolus administration of intravenous contrast. CONTRAST:  100mL OMNIPAQUE IOHEXOL 300 MG/ML  SOLN COMPARISON:  None. FINDINGS: Lower chest: Basilar areas of atelectatic change. Normal heart size. No pericardial effusion. Atherosclerotic calcification of the coronary arteries. Hepatobiliary: No focal liver  abnormality is seen. Patient is post cholecystectomy. Slight prominence of the biliary tree likely related to reservoir effect. No calcified intraductal gallstones. Pancreas: Unremarkable. No pancreatic ductal dilatation or surrounding inflammatory changes. Spleen: Scattered punctate calcifications throughout the spleen, likely sequela of remote  granulomatous infection Adrenals/Urinary Tract: Normal adrenal glands. Punctate nonobstructing calculi seen in the interpolar right kidney and lower pole left kidney. Kidneys are otherwise unremarkable, obstructive urolithiasis, suspicious lesion, or hydronephrosis. Bladder is unremarkable. Stomach/Bowel: Distal esophagus, stomach and duodenal sweep are unremarkable. No bowel wall thickening or dilatation. No evidence of obstruction. The appendix is not well visualized though no pericecal inflammation is evident. Scattered colonic diverticula without focal pericolonic inflammation to suggest diverticulitis. Vascular/Lymphatic: Atherosclerotic plaque within the normal caliber aorta. No suspicious or enlarged lymph nodes in the included lymphatic chains. Reproductive: Uterus is surgically absent. No concerning adnexal lesions. Other: No abdominopelvic free fluid or free gas. No bowel containing hernias. Mild body wall edema in the flanks. Musculoskeletal: Multilevel degenerative changes are present in the imaged portions of the spine. Findings maximal L4-S1 No acute osseous abnormality or suspicious osseous lesion. IMPRESSION: No acute intra-abdominal process. Diverticulosis without evidence diverticulitis. Aortic Atherosclerosis (ICD10-I70.0). Electronically Signed   By: Lovena Le M.D.   On: 04/27/2019 00:32    ____________________________________________   PROCEDURES  Procedure(s) performed:   Procedures  None ____________________________________________   INITIAL IMPRESSION / ASSESSMENT AND PLAN / ED COURSE  Pertinent labs & imaging results that were available during my care of the patient were reviewed by me and considered in my medical decision making (see chart for details).   Patient presents to the emergency department for evaluation of vomiting intermittently over the past 4 months.  She has had weight loss.  Suspecting that this was from stroke or possible new bleeding I obtained a CT  scan of the head which showed old changes and nothing acute.  Lab work reviewed with no acute findings.  Patient is asymptomatic at this time.  I spoke with her significant other by phone who states that actually her primary care physician was concerned for possible bowel obstruction.  Abdomen is soft and nontender.  Given the additional history and the patient's memory difficulty I will order a CT abdomen pelvis.  If normal, plan for follow-up with neurology.   Care transferred to Dr. Tomi Bamberger.  ____________________________________________  FINAL CLINICAL IMPRESSION(S) / ED DIAGNOSES  Final diagnoses:  Non-intractable vomiting with nausea, unspecified vomiting type     MEDICATIONS GIVEN DURING THIS VISIT:  Medications  sodium chloride flush (NS) 0.9 % injection 3 mL (3 mLs Intravenous Given 04/26/19 2032)  sodium chloride 0.9 % bolus 500 mL (0 mLs Intravenous Stopped 04/26/19 2330)  iohexol (OMNIPAQUE) 300 MG/ML solution 100 mL (100 mLs Intravenous Contrast Given 04/27/19 0001)     Note:  This document was prepared using Dragon voice recognition software and may include unintentional dictation errors.  Nanda Quinton, MD Emergency Medicine    Jadavion Spoelstra, Wonda [facility], MD 04/27/19 1210

## 2019-04-26 NOTE — Discharge Instructions (Signed)
You were seen in the ED today with vomiting. I am adding a nausea medication but feel that this vomiting may be related to damage done by your stroke. Call the Neurologist listed for the next available appointment. Return to the ED with any new or worsening symptoms.

## 2019-04-26 NOTE — ED Triage Notes (Signed)
Patient reports ongoing emesis at least 2 days a week since a stroke 4 months ago. Weight loss of approx 30 pounds.

## 2019-04-26 NOTE — Telephone Encounter (Signed)
agree

## 2019-04-26 NOTE — Telephone Encounter (Signed)
Aware of provider's advice to go to ED.  Message left for home health.

## 2019-04-26 NOTE — Telephone Encounter (Signed)
TC from [place] w/ [place] Pt still having N & V, c/o vomiting QD Been in the been every day since been w/ [facility] Did not go to ED as recommended from 04/19/19 televisit w/ Particia Nearing. She has constipation then bouts of diarrhea, which could be a blockage Re-interated the need to go to the ED d/t the N&V, possible dehydration & blockage, they can give her IVs and do CT scans

## 2019-04-26 NOTE — ED Triage Notes (Signed)
Patient reports dark stools.

## 2019-04-27 NOTE — ED Provider Notes (Signed)
Patient left a change of shift to get results of her abdominal CT. Her CT is without acute findings, she was discharged home with Dr. Hayden Pedro discharge instructions.    Ct Abdomen Pelvis W Contrast  Result Date: 04/27/2019 CLINICAL DATA:  Ongoing emesis since stroke 4 months prior EXAM: CT ABDOMEN AND PELVIS WITH CONTRAST TECHNIQUE: Multidetector CT imaging of the abdomen and pelvis was performed using the standard protocol following bolus administration of intravenous contrast. CONTRAST:  138mL OMNIPAQUE IOHEXOL 300 MG/ML  SOLN COMPARISON:  None. FINDINGS: Lower chest: Basilar areas of atelectatic change. Normal heart size. No pericardial effusion. Atherosclerotic calcification of the coronary arteries. Hepatobiliary: No focal liver abnormality is seen. Patient is post cholecystectomy. Slight prominence of the biliary tree likely related to reservoir effect. No calcified intraductal gallstones. Pancreas: Unremarkable. No pancreatic ductal dilatation or surrounding inflammatory changes. Spleen: Scattered punctate calcifications throughout the spleen, likely sequela of remote granulomatous infection Adrenals/Urinary Tract: Normal adrenal glands. Punctate nonobstructing calculi seen in the interpolar right kidney and lower pole left kidney. Kidneys are otherwise unremarkable, obstructive urolithiasis, suspicious lesion, or hydronephrosis. Bladder is unremarkable. Stomach/Bowel: Distal esophagus, stomach and duodenal sweep are unremarkable. No bowel wall thickening or dilatation. No evidence of obstruction. The appendix is not well visualized though no pericecal inflammation is evident. Scattered colonic diverticula without focal pericolonic inflammation to suggest diverticulitis. Vascular/Lymphatic: Atherosclerotic plaque within the normal caliber aorta. No suspicious or enlarged lymph nodes in the included lymphatic chains. Reproductive: Uterus is surgically absent. No concerning adnexal lesions. Other: No  abdominopelvic free fluid or free gas. No bowel containing hernias. Mild body wall edema in the flanks. Musculoskeletal: Multilevel degenerative changes are present in the imaged portions of the spine. Findings maximal L4-S1 No acute osseous abnormality or suspicious osseous lesion. IMPRESSION: No acute intra-abdominal process. Diverticulosis without evidence diverticulitis. Aortic Atherosclerosis (ICD10-I70.0). Electronically Signed   By: Lovena Le M.D.   On: 04/27/2019 00:32      Rolland Porter, MD 04/27/19 0127

## 2019-05-02 ENCOUNTER — Ambulatory Visit (INDEPENDENT_AMBULATORY_CARE_PROVIDER_SITE_OTHER): Payer: Medicare PPO | Admitting: Family

## 2019-05-02 ENCOUNTER — Encounter: Payer: Self-pay | Admitting: Family

## 2019-05-02 DIAGNOSIS — G43119 Migraine with aura, intractable, without status migrainosus: Secondary | ICD-10-CM | POA: Diagnosis not present

## 2019-05-02 MED ORDER — SUMATRIPTAN SUCCINATE 50 MG PO TABS: 50.0000 mg | ORAL_TABLET | ORAL | 0 refills | Status: DC | PRN

## 2019-05-02 MED ORDER — SUMATRIPTAN SUCCINATE 50 MG PO TABS: 50.0000 mg | ORAL_TABLET | ORAL | 0 refills | Status: AC | PRN

## 2019-05-02 NOTE — Progress Notes (Signed)
Virtual Visit via telephone Note Due to COVID-19 pandemic this visit was conducted virtually. This visit type was conducted due to national recommendations for restrictions regarding the COVID-19 Pandemic (e.g. social distancing, sheltering in place) in an effort to limit this patient's exposure and mitigate transmission in our community. All issues noted in this document were discussed and addressed.  A physical exam was not performed with this format.  I connected with [name] on 05/02/19 at 10:38 AM by telephone and verified that I am speaking with the correct person using two identifiers. Diona Peregoy is currently located at home and no one is currently with her during visit. The provider, Evelina Dun, FNP is located in their office at time of visit.  I discussed the limitations, risks, security and privacy concerns of performing an evaluation and management service by telephone and the availability of in person appointments. I also discussed with the patient that there may be a patient responsible charge related to this service. The patient expressed understanding and agreed to proceed.   History and Present Illness:  Pt calls today with complaints of a migraine. She reports she has a hx of CVA and migraines. She states her last migraine was this bad was over a year ago. She has taken motrin with mild relief.  Migraine  This is a recurrent problem. The current episode started yesterday. The problem occurs intermittently. The problem has been waxing and waning. The pain is located in the parietal region. The pain radiates to the left shoulder. The pain quality is similar to prior headaches. The quality of the pain is described as aching and dull. The pain is at a severity of 8/10. The pain is moderate. Associated symptoms include blurred vision (yesterday, but improved now), phonophobia and photophobia. She has tried NSAIDs for the symptoms. The treatment provided mild relief. Her past  medical history is significant for migraine headaches.      Review of Systems  Eyes: Positive for blurred vision (yesterday, but improved now) and photophobia.  All other systems reviewed and are negative.    Observations/Objective: No SOB or distress  Assessment and Plan: 1. Intractable migraine with aura without status migrainosus Given her hx of CVA, we discussed in length red flag and if headache worsens or does not improve she will go to ED. Stress management discussed Encouraged at least 8 hours of sleep Keep follow up with PCP  - SUMAtriptan (IMITREX) 50 MG tablet; Take 1 tablet (50 mg total) by mouth every 2 (two) hours as needed for migraine. May repeat in 2 hours if headache persists, Max 200 mg/day  Dispense: 10 tablet; Refill: 0     I discussed the assessment and treatment plan with the patient. The patient was provided an opportunity to ask questions and all were answered. The patient agreed with the plan and demonstrated an understanding of the instructions.   The patient was advised to call back or seek an in-person evaluation if the symptoms worsen or if the condition fails to improve as anticipated.  The above assessment and management plan was discussed with the patient. The patient verbalized understanding of and has agreed to the management plan. Patient is aware to call the clinic if symptoms persist or worsen. Patient is aware when to return to the clinic for a follow-up visit. Patient educated on when it is appropriate to go to the emergency department.   Time call ended:  11:00 AM   I provided 22 minutes of non-face-to-face time  during this encounter.    Jannifer Rodneyhristy Katora Fini, FNP

## 2019-05-18 ENCOUNTER — Telehealth: Payer: Self-pay | Admitting: *Deleted

## 2019-05-18 NOTE — Telephone Encounter (Signed)
Erroneous

## 2019-05-21 ENCOUNTER — Telehealth: Payer: Self-pay | Admitting: *Deleted

## 2019-05-21 ENCOUNTER — Other Ambulatory Visit: Payer: Self-pay | Admitting: Physician Assistant

## 2019-05-21 NOTE — Telephone Encounter (Signed)
insulin detemir (LEVEMIR) 100 UNIT/ML injection 04/10/2019 11 ordered       Dose, Frequency: 16-60 Units, Daily     Summary: Inject 0.16-0.6 mLs (16-60 Units total) into the skin daily., Starting Tue 04/10/2019, Normal     Levemir I ordered this on 04/10/19. Has this been changed since then?

## 2019-05-21 NOTE — Telephone Encounter (Signed)
Pt & husband aware on Levemir

## 2019-05-21 NOTE — Telephone Encounter (Signed)
VM from [place] w/ [facility] Pt was put on Lantus  25 u QHS in [facility] Pt's husband gave her last dose last pm, needs refill sent to [place] Please advise

## 2019-06-04 ENCOUNTER — Other Ambulatory Visit: Payer: Self-pay

## 2019-06-04 ENCOUNTER — Ambulatory Visit (INDEPENDENT_AMBULATORY_CARE_PROVIDER_SITE_OTHER): Payer: Medicare PPO

## 2019-06-04 DIAGNOSIS — Z794 Long term (current) use of insulin: Secondary | ICD-10-CM

## 2019-06-04 DIAGNOSIS — Z8673 Personal history of transient ischemic attack (TIA), and cerebral infarction without residual deficits: Secondary | ICD-10-CM

## 2019-06-04 DIAGNOSIS — M1990 Unspecified osteoarthritis, unspecified site: Secondary | ICD-10-CM | POA: Diagnosis not present

## 2019-06-04 DIAGNOSIS — I10 Essential (primary) hypertension: Secondary | ICD-10-CM | POA: Diagnosis not present

## 2019-06-04 DIAGNOSIS — F3289 Other specified depressive episodes: Secondary | ICD-10-CM

## 2019-06-04 DIAGNOSIS — J690 Pneumonitis due to inhalation of food and vomit: Secondary | ICD-10-CM

## 2019-06-04 DIAGNOSIS — E1140 Type 2 diabetes mellitus with diabetic neuropathy, unspecified: Secondary | ICD-10-CM | POA: Diagnosis not present

## 2019-06-04 DIAGNOSIS — Z7902 Long term (current) use of antithrombotics/antiplatelets: Secondary | ICD-10-CM

## 2019-06-04 DIAGNOSIS — F419 Anxiety disorder, unspecified: Secondary | ICD-10-CM | POA: Diagnosis not present

## 2019-06-04 DIAGNOSIS — Z87891 Personal history of nicotine dependence: Secondary | ICD-10-CM

## 2019-06-04 DIAGNOSIS — Z9181 History of falling: Secondary | ICD-10-CM

## 2019-06-04 DIAGNOSIS — K219 Gastro-esophageal reflux disease without esophagitis: Secondary | ICD-10-CM

## 2019-06-13 ENCOUNTER — Other Ambulatory Visit: Payer: Self-pay | Admitting: *Deleted

## 2019-06-13 MED ORDER — ACCU-CHEK AVIVA VI SOLN: 3 refills | Status: AC

## 2019-06-14 ENCOUNTER — Other Ambulatory Visit: Payer: Self-pay | Admitting: *Deleted

## 2019-06-14 MED ORDER — BD SWAB SINGLE USE REGULAR PADS: MEDICATED_PAD | 3 refills | Status: AC

## 2019-06-14 MED ORDER — ACCU-CHEK AVIVA PLUS W/DEVICE KIT: PACK | 0 refills | Status: AC

## 2019-06-14 MED ORDER — ACCU-CHEK FASTCLIX LANCETS MISC: 3 refills | Status: AC

## 2019-06-14 MED ORDER — ACCU-CHEK AVIVA PLUS VI STRP: ORAL_STRIP | 3 refills | Status: AC

## 2019-09-13 ENCOUNTER — Other Ambulatory Visit: Payer: Self-pay | Admitting: Physician Assistant

## 2019-09-14 ENCOUNTER — Other Ambulatory Visit: Payer: Self-pay | Admitting: Physician Assistant

## 2020-01-21 ENCOUNTER — Telehealth: Payer: Self-pay

## 2020-01-21 NOTE — Telephone Encounter (Signed)
Patient has not been seen here since April 2020 for diabetes.  Are you sure she is not established with an endocrinologist? If she is, I will defer to their management.  If not, she needs to be seen ASAP for medication management

## 2020-01-21 NOTE — Telephone Encounter (Signed)
Patient states she is no longer a patient here. She moved to somewhere closer to home.

## 2020-01-21 NOTE — Telephone Encounter (Signed)
Received a fax from [facility] medication no longer covered by patient's insurnace.   Medication: Isulin ASPA INJ flexpen Date Filled: 01/18/2020 Reason for notification: This drug is not on our formulary.  We will not continue to pay for this drug after you have received the maximum 30 day temporary supply.   NO ALTERNATIVE was given   [facility] patient - has not scheduled with new PCP  Covering pcp- please advise and send back to pools

## 2020-04-30 ENCOUNTER — Other Ambulatory Visit: Payer: Self-pay | Admitting: *Deleted

## 2020-04-30 NOTE — Telephone Encounter (Signed)
Former jones. NTBS LOV 05/02/19

## 2021-04-25 IMAGING — CT CT ABDOMEN AND PELVIS WITH CONTRAST
2 of 4 series · 16 of 46 positions shown, 18 images · IV contrast (Isovue)
Comparison: None.

CLINICAL DATA: Ongoing emesis since stroke 4 months prior

EXAM:
CT ABDOMEN AND PELVIS WITH CONTRAST
TECHNIQUE: Multidetector CT imaging of the abdomen and pelvis was performed
using the standard protocol following bolus administration of
intravenous contrast.
CONTRAST:  100mL OMNIPAQUE IOHEXOL 300 MG/ML  SOLN

[Series 2: axial st · axial · 0.79mm/px · z∈[-688,-293]mm · 13 of 89 slices shown, 15 images]
[im 5/89  soft-tissue]
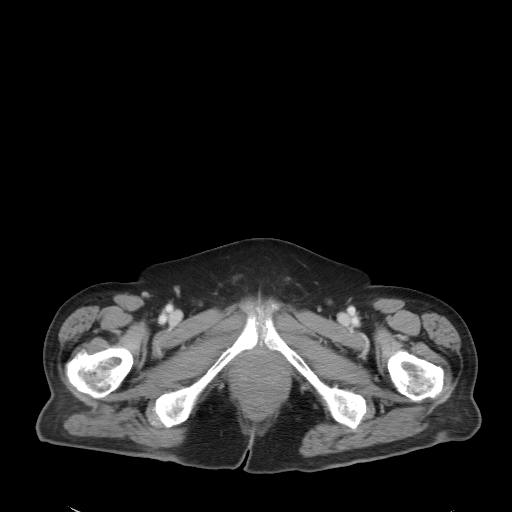
[im 5/89  bone]
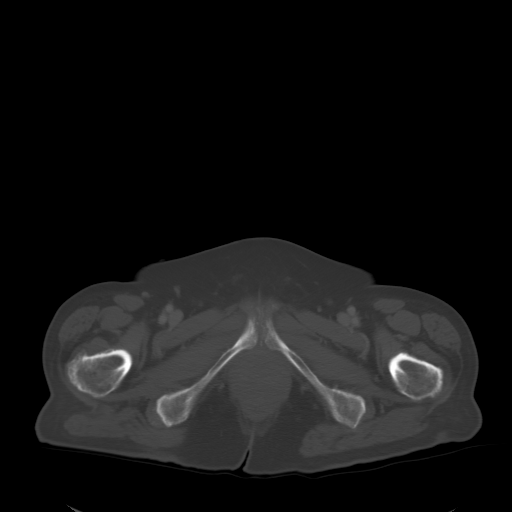
[im 13/89  soft-tissue]
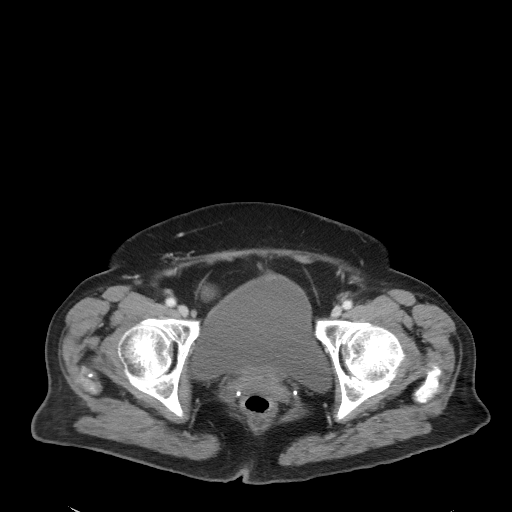
[im 17/89  soft-tissue]
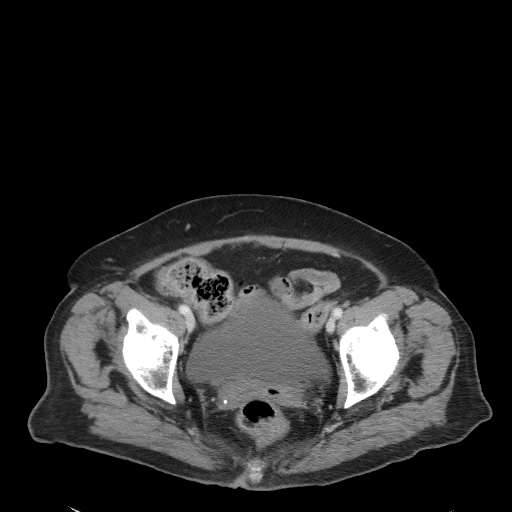
[im 26/89  soft-tissue]
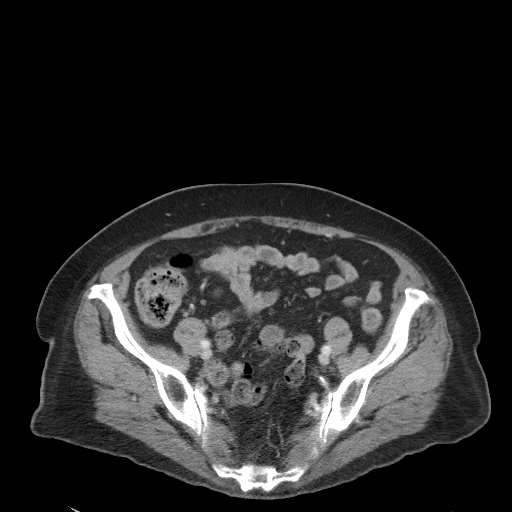
[im 30/89  soft-tissue]
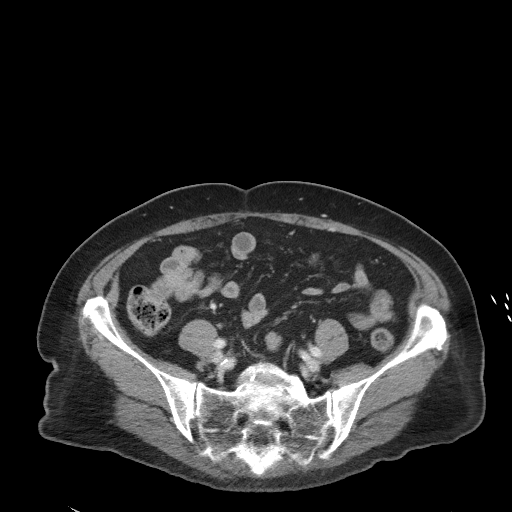
[im 38/89  soft-tissue]
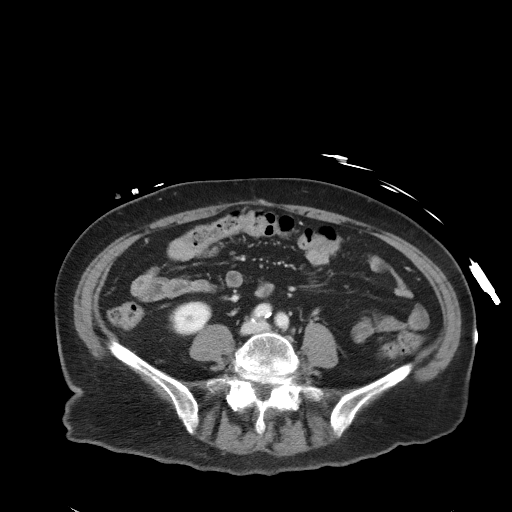
[im 47/89  soft-tissue]
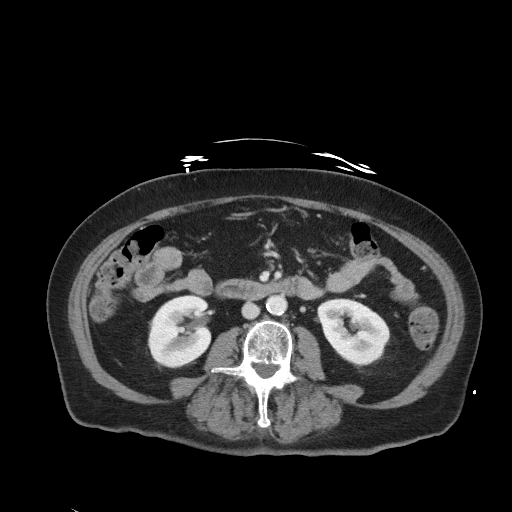
[im 51/89  soft-tissue]
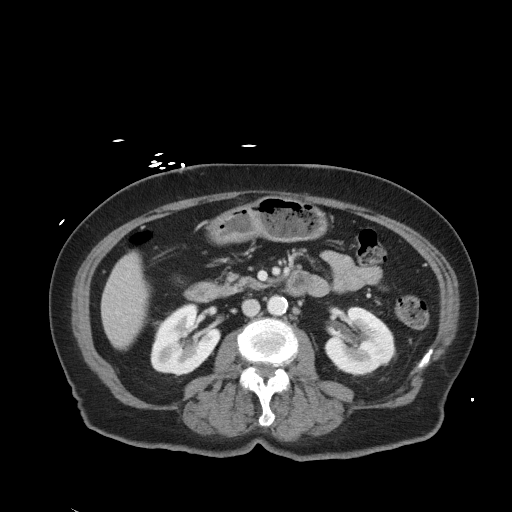
[im 59/89  soft-tissue]
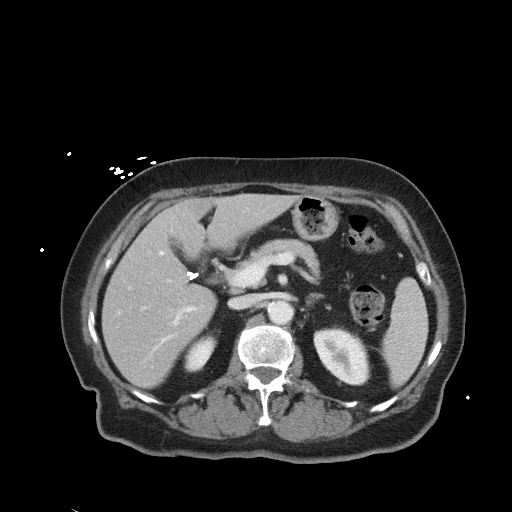
[im 59/89  bone]
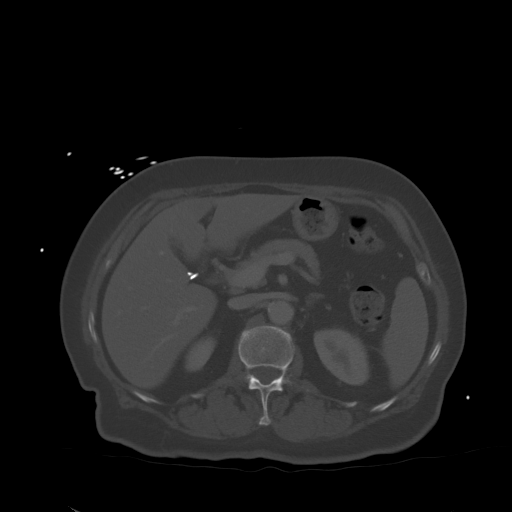
[im 63/89  soft-tissue]
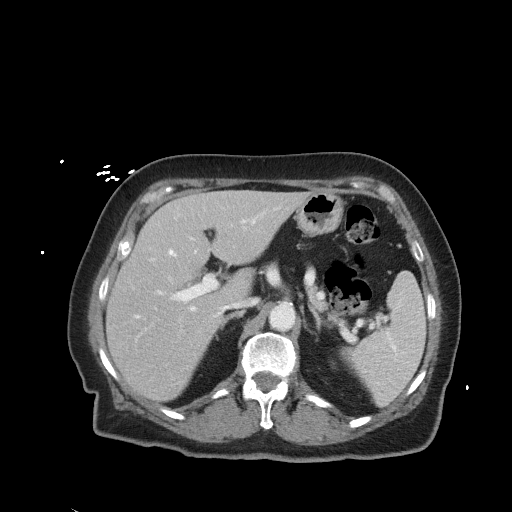
[im 72/89  soft-tissue]
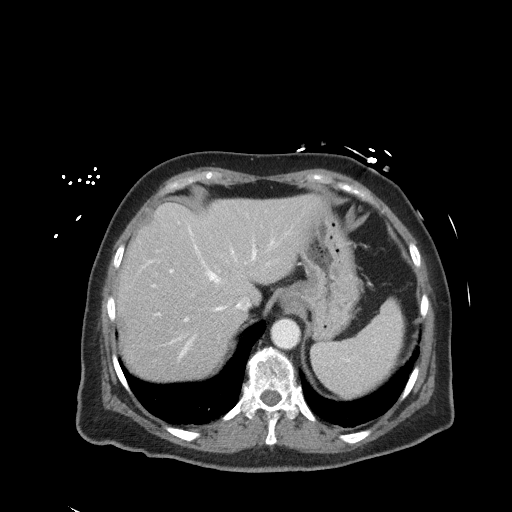
[im 76/89  soft-tissue]
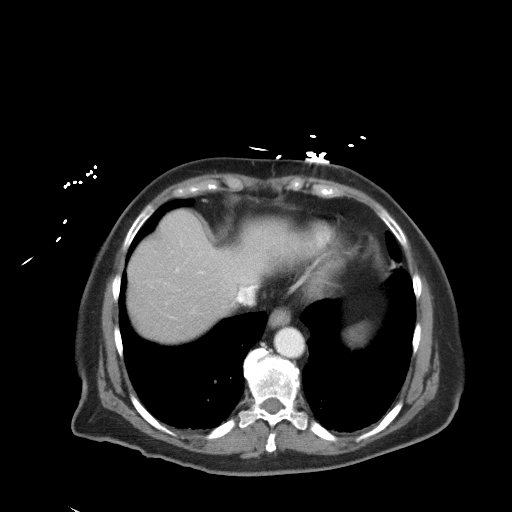
[im 84/89  soft-tissue]
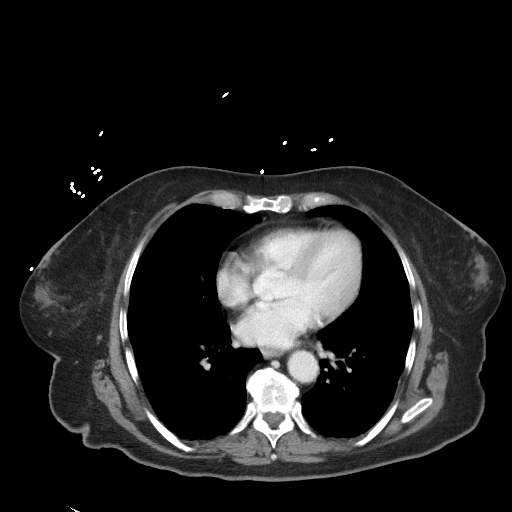

[Series 5: coronal st · coronal · 0.77mm/px · 3 of 84 slices shown]
[im 28/84  soft-tissue]
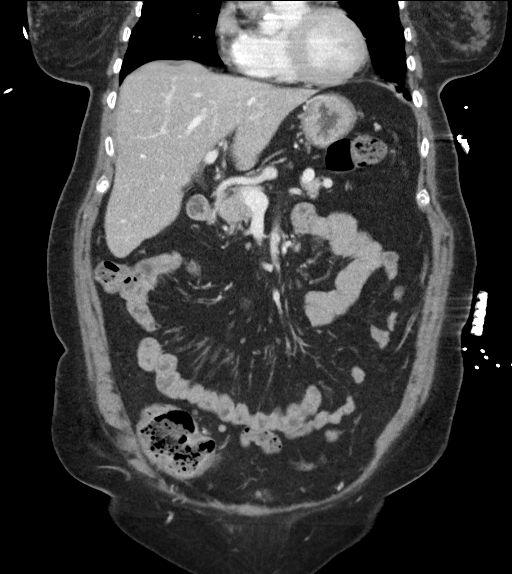
[im 37/84  soft-tissue]
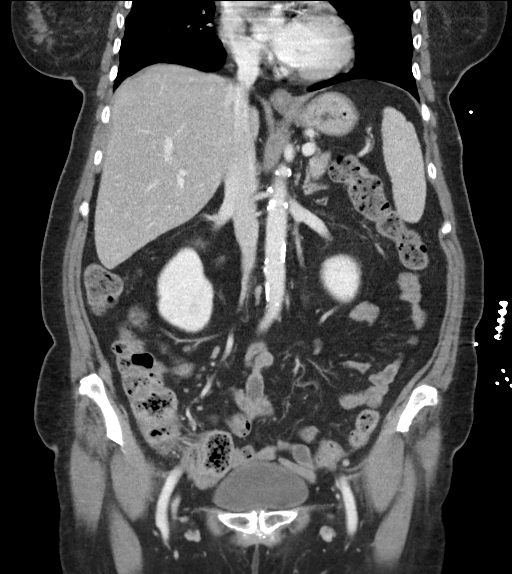
[im 47/84  soft-tissue]
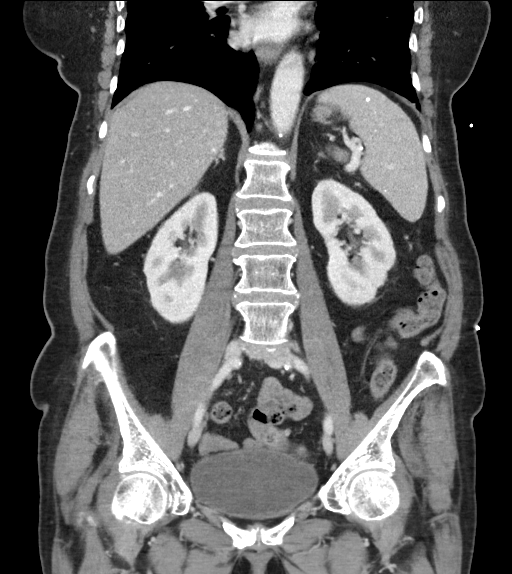

[16 of 46 positions shown; findings below may reference images not displayed]

FINDINGS: Lower chest: Basilar areas of atelectatic change. Normal heart size.
No pericardial effusion. Atherosclerotic calcification of the
coronary arteries.

Hepatobiliary: No focal liver abnormality is seen. Patient is post
cholecystectomy. Slight prominence of the biliary tree likely
related to reservoir effect. No calcified intraductal gallstones.

Pancreas: Unremarkable. No pancreatic ductal dilatation or
surrounding inflammatory changes.

Spleen: Scattered punctate calcifications throughout the spleen,
likely sequela of remote granulomatous infection

Adrenals/Urinary Tract: Normal adrenal glands. Punctate
nonobstructing calculi seen in the interpolar right kidney and lower
pole left kidney. Kidneys are otherwise unremarkable, obstructive
urolithiasis, suspicious lesion, or hydronephrosis. Bladder is
unremarkable.

Stomach/Bowel: Distal esophagus, stomach and duodenal sweep are
unremarkable. No bowel wall thickening or dilatation. No evidence of
obstruction. The appendix is not well visualized though no pericecal
inflammation is evident. Scattered colonic diverticula without focal
pericolonic inflammation to suggest diverticulitis.

Vascular/Lymphatic: Atherosclerotic plaque within the normal caliber
aorta. No suspicious or enlarged lymph nodes in the included
lymphatic chains.

Reproductive: Uterus is surgically absent. No concerning adnexal
lesions.

Other: No abdominopelvic free fluid or free gas. No bowel containing
hernias. Mild body wall edema in the flanks.

Musculoskeletal: Multilevel degenerative changes are present in the
imaged portions of the spine. Findings maximal L4-S1 No acute
osseous abnormality or suspicious osseous lesion.
IMPRESSION: No acute intra-abdominal process.

Diverticulosis without evidence diverticulitis.

Aortic Atherosclerosis ([id number].B).
